# Patient Record
Sex: Female | Born: 1995 | Hispanic: Yes | Marital: Married | Smoking: Never smoker
Health system: Southern US, Community
[De-identification: ages and names within clinical notes are randomized; demographics above are authoritative.]

## PROBLEM LIST (undated history)

## (undated) DIAGNOSIS — K219 Gastro-esophageal reflux disease without esophagitis: Secondary | ICD-10-CM

## (undated) DIAGNOSIS — D649 Anemia, unspecified: Secondary | ICD-10-CM

## (undated) DIAGNOSIS — R110 Nausea: Secondary | ICD-10-CM

## (undated) HISTORY — PX: OTHER SURGICAL HISTORY: SHX169

## (undated) HISTORY — DX: Anemia, unspecified: D64.9

## (undated) HISTORY — DX: Nausea: R11.0

## (undated) HISTORY — DX: Gastro-esophageal reflux disease without esophagitis: K21.9

---

## 2014-02-12 NOTE — L&D Delivery Note (Signed)
Delivery Note At 2:21 PM a viable female infant was delivered via Vaginal, Spontaneous Delivery (Presentation: LOA ;  ).  APGAR: 8, 9; weight 7 lb 14.3 oz (3580 g).   Placenta status: Intact, Spontaneous.  Cord: 3 vessels with the following complications: .   Anesthesia: Epidural  Episiotomy:   Lacerations: 2nd degree;Perineal Suture Repair: 2.0 chromic Est. Blood Loss (mL):  200cc  Mom to postpartum.  Baby to Couplet care / Skin to Skin.  Leonette MostJohanna K Halfon 01/01/2015, 2:40 PM

## 2014-05-16 ENCOUNTER — Emergency Department
Admit: 2014-05-16 | Disposition: A | Discharge status: Home / Self Care | Payer: Self-pay | Admitting: Emergency Medicine

## 2014-05-16 LAB — CBC
HCT: 39.3 % (ref 35.0–47.0)
HGB: 12.8 g/dL (ref 12.0–16.0)
MCH: 30.6 pg (ref 26.0–34.0)
MCHC: 32.6 g/dL (ref 32.0–36.0)
MCV: 94 fL (ref 80–100)
PLATELETS: 320 10*3/uL (ref 150–440)
RBC: 4.18 10*6/uL (ref 3.80–5.20)
RDW: 13.3 % (ref 11.5–14.5)
WBC: 13.8 10*3/uL — ABNORMAL HIGH (ref 3.6–11.0)

## 2014-05-16 LAB — URINALYSIS, COMPLETE
Bilirubin,UR: NEGATIVE
Blood: NEGATIVE
Glucose,UR: NEGATIVE mg/dL (ref 0–75)
Ketone: NEGATIVE
Leukocyte Esterase: NEGATIVE
Nitrite: NEGATIVE
PH: 9 (ref 4.5–8.0)
RBC,UR: 1 /HPF (ref 0–5)
Specific Gravity: 1.02 (ref 1.003–1.030)
Squamous Epithelial: 1
WBC UR: 4 /HPF (ref 0–5)

## 2014-05-16 LAB — HCG, QUANTITATIVE, PREGNANCY: Beta Hcg, Quant.: 66131 m[IU]/mL — ABNORMAL HIGH

## 2014-05-17 LAB — WET PREP, GENITAL

## 2014-05-17 LAB — GC/CHLAMYDIA PROBE AMP

## 2014-05-17 IMAGING — US US OB < 14 WEEKS - US OB TV
1 series · 14 of 28 positions shown · non-contrast
Comparison: None.

CLINICAL DATA: Vaginal bleeding for 1 day. Right pelvic pain for 1
week. Estimated gestational age by LMP is 7 weeks 1 day.
Quantitative beta HCG is [DATE].

EXAM:
OBSTETRIC <14 WK US AND TRANSVAGINAL OB US
TECHNIQUE: Both transabdominal and transvaginal ultrasound examinations were
performed for complete evaluation of the gestation as well as the
maternal uterus, adnexal regions, and pelvic cul-de-sac.
Transvaginal technique was performed to assess early pregnancy.

[Series 1: us ob < 14 weeks - us ob tv · 0.21mm/px · 14 of 96 slices shown]
[im 4/96]
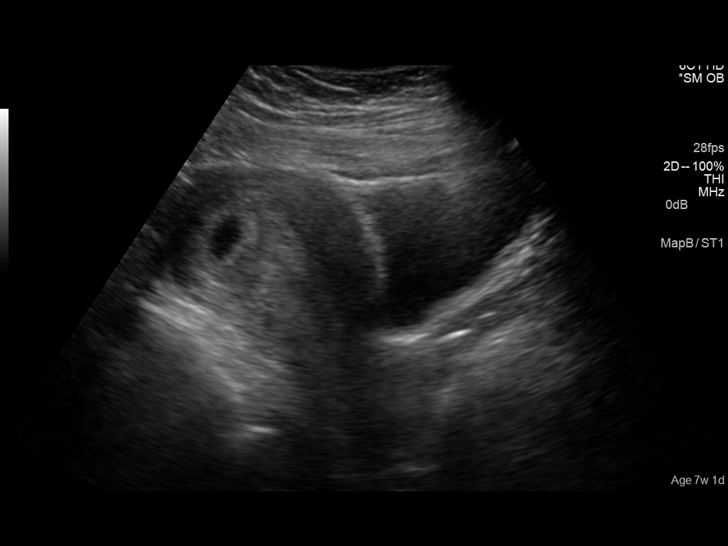
[im 11/96]
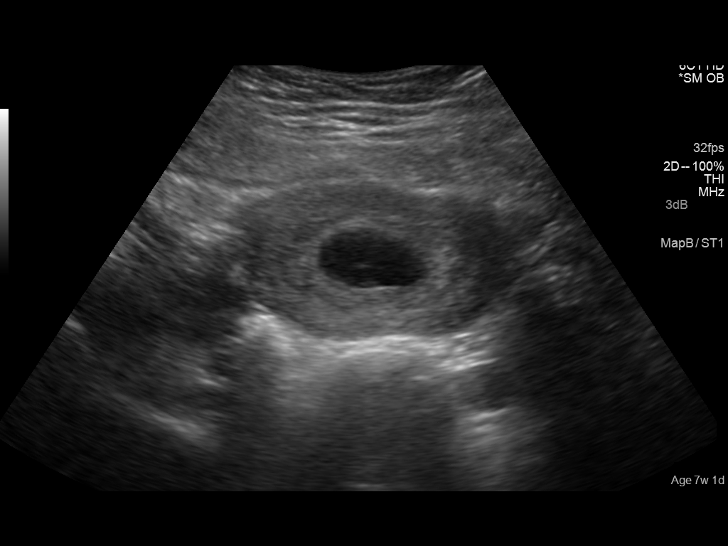
[im 18/96]
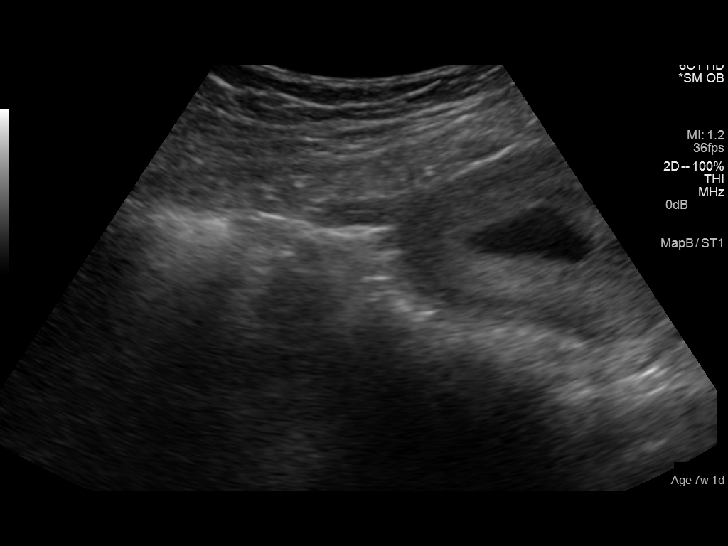
[im 25/96]
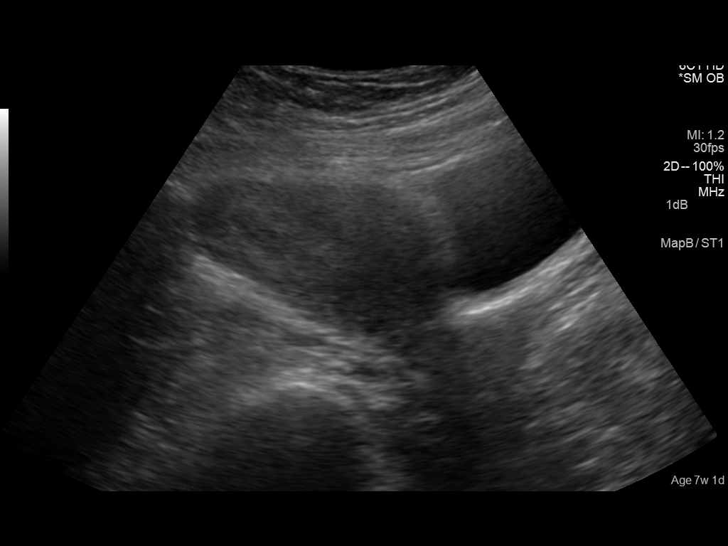
[im 32/96]
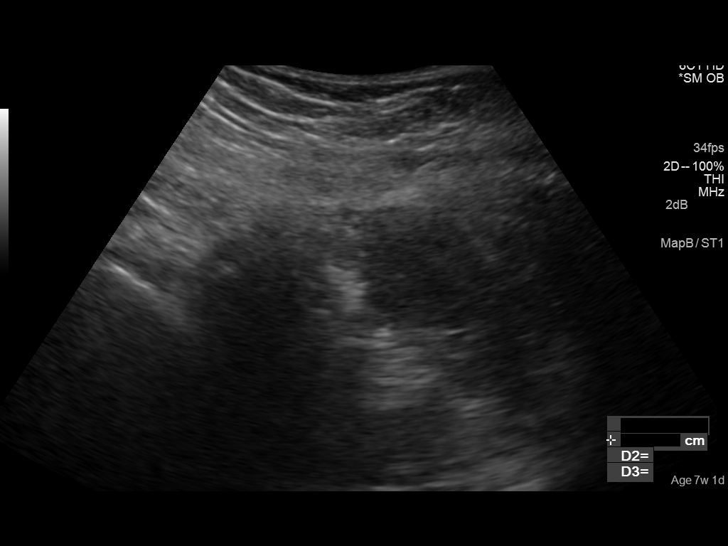
[im 39/96]
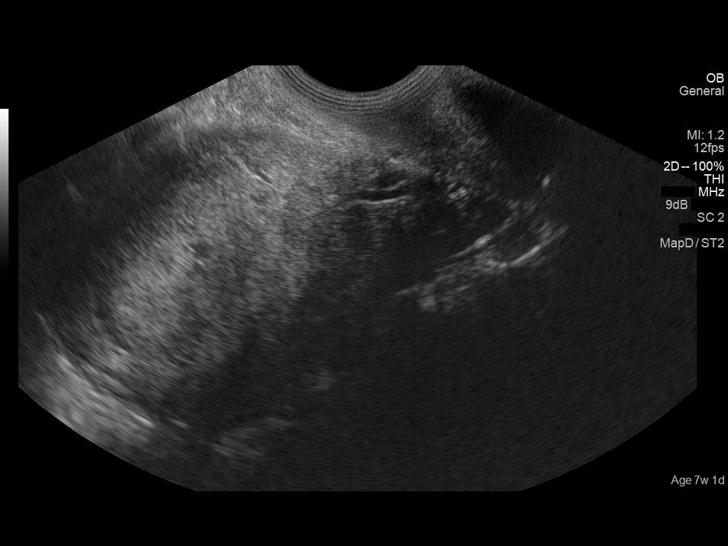
[im 46/96]
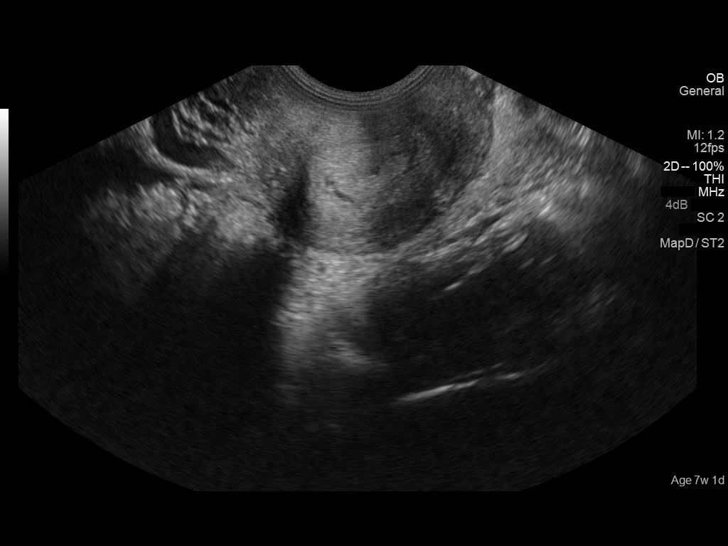
[im 53/96]
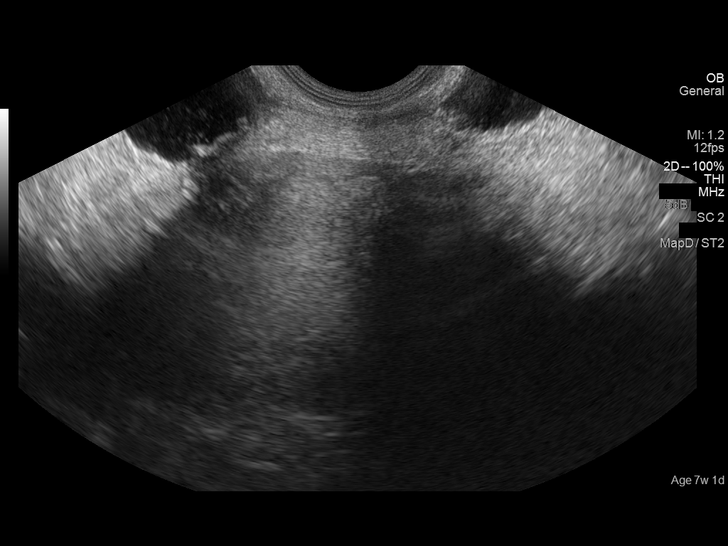
[im 60/96]
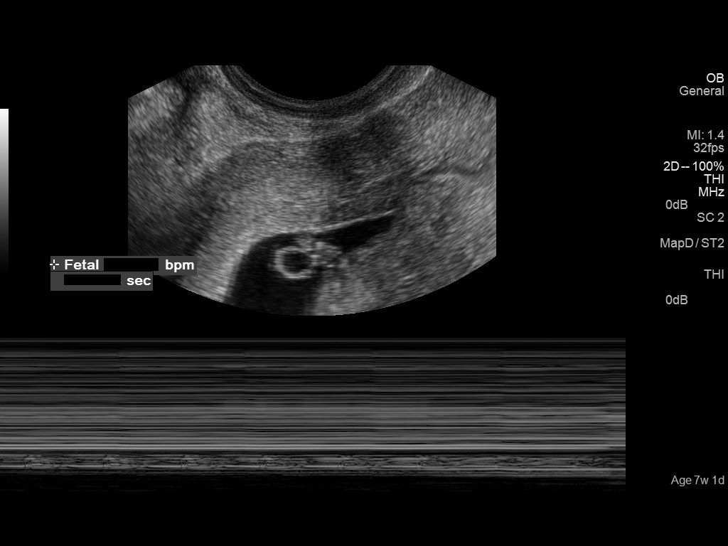
[im 67/96]
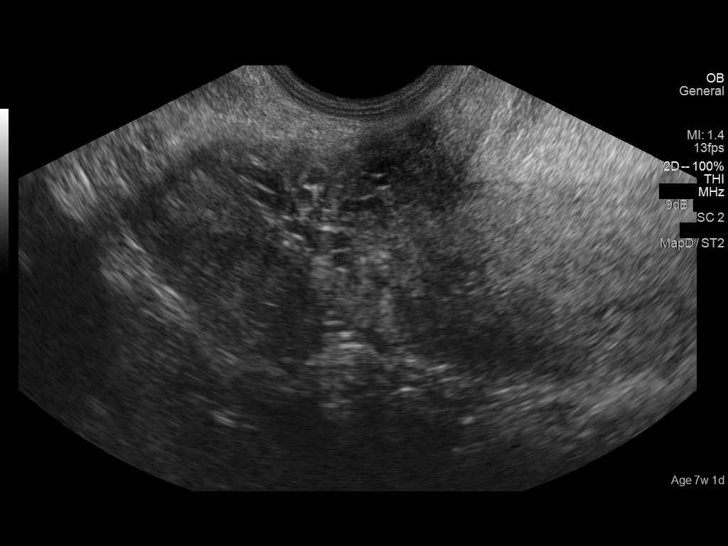
[im 74/96]
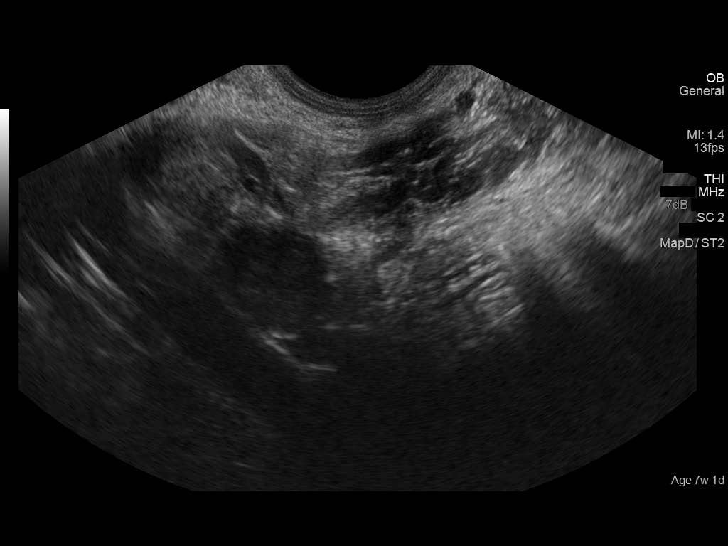
[im 81/96]
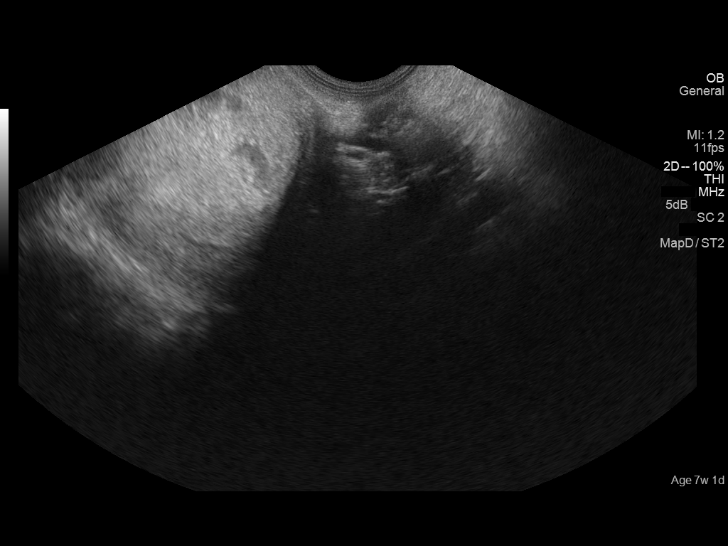
[im 88/96]
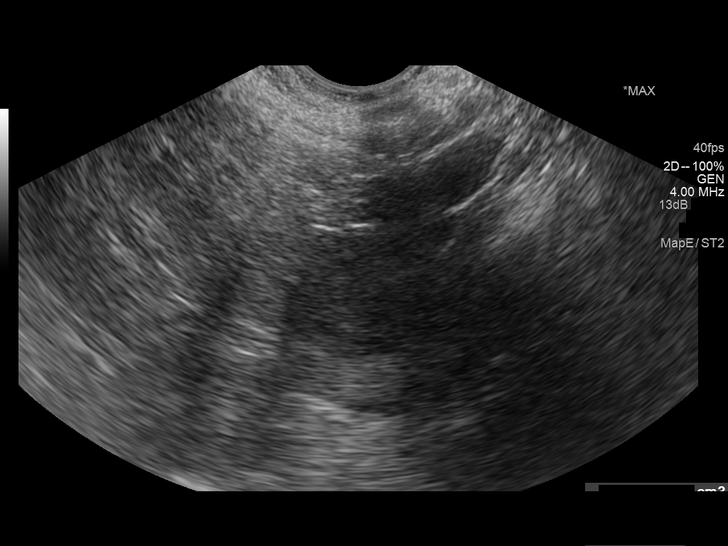
[im 96/96]
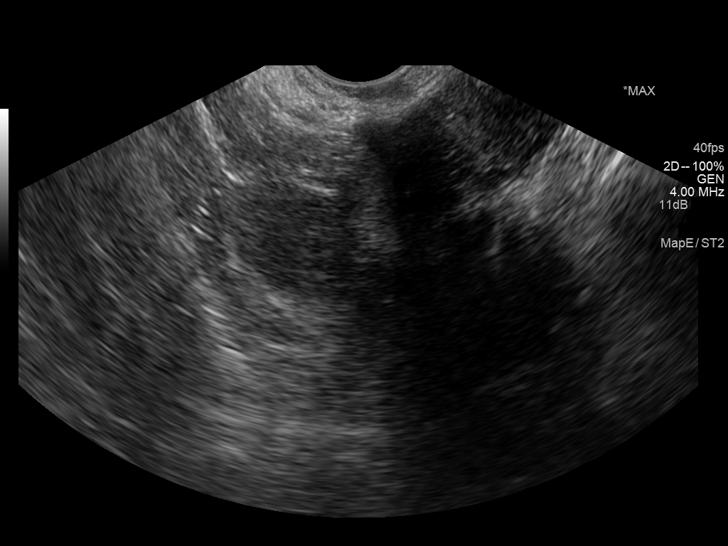

[14 of 28 positions shown; findings below may reference images not displayed]

FINDINGS: Intrauterine gestational sac: Single intrauterine gestational sac is
present.

Yolk sac:  Yolk sac is visualized.

Embryo:  Fetal pole is present.

Cardiac Activity: Fetal cardiac activity is visualized.

Heart Rate: 131  bpm

CRL:  7  mm   6 w   4 d                  US EDC: [DATE]

Maternal uterus/adnexae: Uterus is anteverted. No myometrial mass
lesions. No subchorionic hemorrhage. Both ovaries are visualized.
Bilateral corpus luteum cysts are suggested. No free pelvic fluid
collections.
IMPRESSION: Single intrauterine pregnancy. Estimated gestational age by
crown-rump length is 6 weeks 4 days. No acute complications
suggested.

## 2014-05-26 ENCOUNTER — Emergency Department
Admit: 2014-05-26 | Disposition: A | Discharge status: Home / Self Care | Payer: Self-pay | Admitting: Emergency Medicine

## 2014-05-26 LAB — CBC WITH DIFFERENTIAL/PLATELET
BASOS PCT: 0.4 %
Basophil #: 0.1 10*3/uL (ref 0.0–0.1)
EOS PCT: 1 %
Eosinophil #: 0.2 10*3/uL (ref 0.0–0.7)
HCT: 38.1 % (ref 35.0–47.0)
HGB: 12.8 g/dL (ref 12.0–16.0)
LYMPHS ABS: 3.7 10*3/uL — AB (ref 1.0–3.6)
Lymphocyte %: 23.7 %
MCH: 31.4 pg (ref 26.0–34.0)
MCHC: 33.5 g/dL (ref 32.0–36.0)
MCV: 94 fL (ref 80–100)
MONO ABS: 1 x10 3/mm — AB (ref 0.2–0.9)
MONOS PCT: 6.5 %
Neutrophil #: 10.8 10*3/uL — ABNORMAL HIGH (ref 1.4–6.5)
Neutrophil %: 68.4 %
Platelet: 341 10*3/uL (ref 150–440)
RBC: 4.07 10*6/uL (ref 3.80–5.20)
RDW: 12.9 % (ref 11.5–14.5)
WBC: 15.7 10*3/uL — AB (ref 3.6–11.0)

## 2014-05-26 LAB — WET PREP, GENITAL

## 2014-05-26 LAB — URINALYSIS, COMPLETE
BLOOD: NEGATIVE
Bilirubin,UR: NEGATIVE
Glucose,UR: NEGATIVE mg/dL (ref 0–75)
Ketone: NEGATIVE
Leukocyte Esterase: NEGATIVE
Nitrite: NEGATIVE
PH: 7 (ref 4.5–8.0)
PROTEIN: NEGATIVE
Specific Gravity: 1.008 (ref 1.003–1.030)

## 2014-05-26 LAB — COMPREHENSIVE METABOLIC PANEL
ALBUMIN: 4.2 g/dL
ALK PHOS: 49 U/L
ANION GAP: 7 (ref 7–16)
BUN: 7 mg/dL
Bilirubin,Total: 0.2 mg/dL — ABNORMAL LOW
CHLORIDE: 107 mmol/L
CREATININE: 0.36 mg/dL — AB
Calcium, Total: 9.1 mg/dL
Co2: 21 mmol/L — ABNORMAL LOW
EGFR (Non-African Amer.): >60
Glucose: 103 mg/dL — ABNORMAL HIGH
POTASSIUM: 3.7 mmol/L
SGOT(AST): 19 U/L
SGPT (ALT): 20 U/L
SODIUM: 135 mmol/L
Total Protein: 7.2 g/dL

## 2014-05-26 LAB — HCG, QUANTITATIVE, PREGNANCY: Beta Hcg, Quant.: 158353 m[IU]/mL — ABNORMAL HIGH

## 2014-05-27 LAB — OB RESULTS CONSOLE VARICELLA ZOSTER ANTIBODY, IGG: Varicella: IMMUNE

## 2014-05-27 LAB — GC/CHLAMYDIA PROBE AMP

## 2014-05-27 LAB — SICKLE CELL SCREEN: Sickle Cell Screen: NEGATIVE

## 2014-05-27 LAB — OB RESULTS CONSOLE RUBELLA ANTIBODY, IGM: Rubella: IMMUNE

## 2014-06-28 ENCOUNTER — Ambulatory Visit
Admission: RE | Admit: 2014-06-28 | Discharge: 2014-06-28 | Disposition: A | Discharge status: Home / Self Care | Payer: Self-pay | Source: Ambulatory Visit | Attending: Maternal & Fetal Medicine | Admitting: Maternal & Fetal Medicine

## 2014-06-28 VITALS — BP 123/71 | HR 97 | Temp 99.1°F | Ht 61.0 in | Wt 156.0 lb

## 2014-06-28 DIAGNOSIS — Z369 Encounter for antenatal screening, unspecified: Secondary | ICD-10-CM

## 2014-06-28 NOTE — Progress Notes (Signed)
History reviewed with CGC, Wells.  Agree with above.

## 2014-06-28 NOTE — Progress Notes (Signed)
Jane Brewer, Jane Brewer* Length of Consultation: 45 minutes   [name]  [facility] to review prenatal screening and testing options.  This note summarizes the information we discussed with the aid of a Spanish interpreter.    First trimester screening includes nuchal translucency ultrasound and first trimester maternal serum marker screening.  The nuchal translucency has approximately an 80% detection rate for Down syndrome and can be positive for other chromosome abnormalities as well as congenital heart defects.  When combined with a maternal serum marker screening, the detection rate is up to 90% for Down syndrome and up to 97% for trisomy 18.     Maternal serum marker screening, a blood test that measures pregnancy proteins, can provide risk assessments for Down syndrome, trisomy 18, and open neural tube defects (spina bifida, anencephaly). Because it does not directly examine the fetus, it cannot positively diagnose or rule out these problems.  Targeted ultrasound uses high frequency sound waves to create an image of the developing fetus.  An ultrasound is often recommended as a routine means of evaluating the pregnancy.  It is also used to screen for fetal anatomy problems (for example, a heart defect) that might be suggestive of a chromosomal or other abnormality.   Should these screening tests indicate an increased concern, then the following diagnostic options would be offered:  The chorionic villus sampling procedure is available for first trimester chromosome analysis.  This involves the withdrawal of a small amount of chorionic villi (tissue from the developing placenta).  Risk of pregnancy loss is estimated to be approximately 1 in 200 to 1 in 100 (0.5 to 1%).  There is approximately a 1% (1 in 100) chance that the CVS chromosome results will be unclear.  Chorionic villi cannot be tested for neural tube defects.      Amniocentesis involves the removal of a small amount of amniotic fluid from the sac surrounding the fetus with the use of a thin needle inserted through the maternal abdomen and uterus.  Ultrasound guidance is used throughout the procedure.  Fetal cells from amniotic fluid are directly evaluated and > 99.5% of chromosome problems and > 98% of open neural tube defects can be detected. This procedure is generally performed after the 15th week of pregnancy.  The main risks to this procedure include complications leading to miscarriage in less than 1 in 200 cases (0.5%).  As another option for information if the pregnancy is suspected to be an an increased chance for certain chromosome conditions, we also reviewed the availability of cell free fetal DNA testing from maternal blood to determine whether or not the baby may have either Down syndrome, trisomy 3313, or trisomy 4318.  This test utilizes a maternal blood sample and DNA sequencing technology to isolate circulating cell free fetal DNA from maternal plasma.  The fetal DNA can then be analyzed for DNA sequences that are derived from the three most common chromosomes involved in aneuploidy, chromosomes 13, 18, and 21.  If the overall amount of DNA is greater than the expected level for any of these chromosomes, aneuploidy is suspected.  While we do not consider it a replacement for invasive testing and karyotype analysis, a negative result from this testing would be reassuring, though not a guarantee of a normal chromosome complement for the baby.  An abnormal result is certainly suggestive of an abnormal chromosome complement, though we would still recommend CVS or amniocentesis to confirm any findings  from this testing.  We obtained a detailed family history and pregnancy history.  The patient reported one paternal first cousin with speech problems and incontinence at age 767.  He has no known birth differences and no specific diagnosis is known for his  developmental differences.  We reviewed that there may be many reasons for the features described, but that without additional medical information, a risk assessment for this pregnancy is difficult.  If more is learned, we are happy to review this information further.  The remainder of the family history was reported to be unremarkable for birth defects, mental retardation, recurrent pregnancy loss or known chromosome abnormalities.  [name] reported no complications or exposures in the pregnancy that would be expected to increase the risk for birth defects.  After consideration of the options, [name] elected to proceed with first trimester screening.  Blood was drawn today for the biochemical portion.  However, due to scheduling issues she was scheduled to return to clinic on Thursday, Jul 01, 2014 at 8:00 am for the nuchal translucency measurement and risk assessment.  [name] was encouraged to call with questions or concerns.  We can be contacted at [phone].  Cherly Andersoneborah F. Davanta Meuser, MS, CGC

## 2014-07-01 ENCOUNTER — Ambulatory Visit
Admission: RE | Admit: 2014-07-01 | Discharge: 2014-07-01 | Disposition: A | Discharge status: Home / Self Care | Payer: Self-pay | Source: Ambulatory Visit | Attending: Obstetrics and Gynecology | Admitting: Obstetrics and Gynecology

## 2014-07-01 VITALS — BP 118/83 | HR 80 | Ht 61.0 in | Wt 158.0 lb

## 2014-07-01 DIAGNOSIS — Z1389 Encounter for screening for other disorder: Secondary | ICD-10-CM

## 2014-07-01 DIAGNOSIS — Z369 Encounter for antenatal screening, unspecified: Secondary | ICD-10-CM

## 2014-07-01 DIAGNOSIS — Z3A13 13 weeks gestation of pregnancy: Secondary | ICD-10-CM | POA: Insufficient documentation

## 2014-07-01 DIAGNOSIS — Z36 Encounter for antenatal screening of mother: Secondary | ICD-10-CM | POA: Insufficient documentation

## 2014-07-01 IMAGING — US US OB COMP LESS 14 WK
1 series · 14 of 28 positions shown · non-contrast
Comparison: none

[Series 1: us ob comp less 14 wk · 0.16mm/px · 14 of 101 slices shown]
[im 4/101]
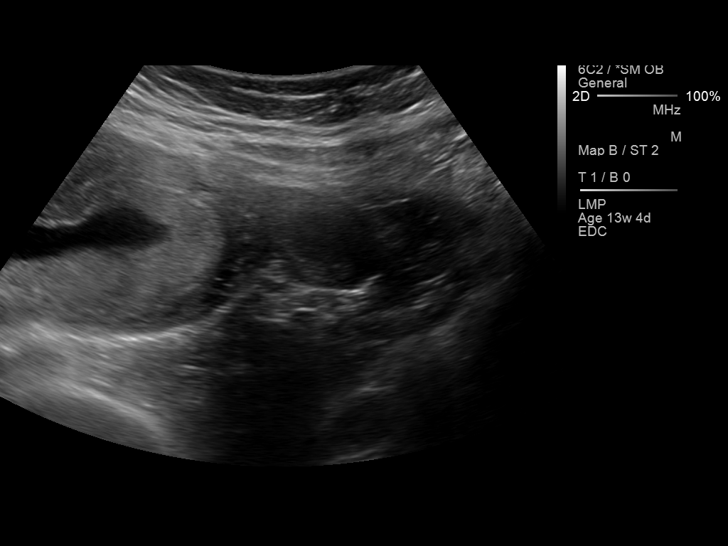
[im 12/101]
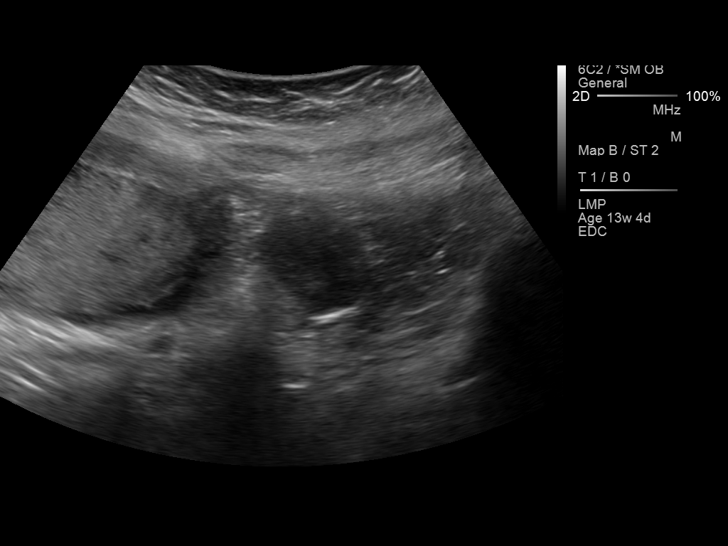
[im 19/101]
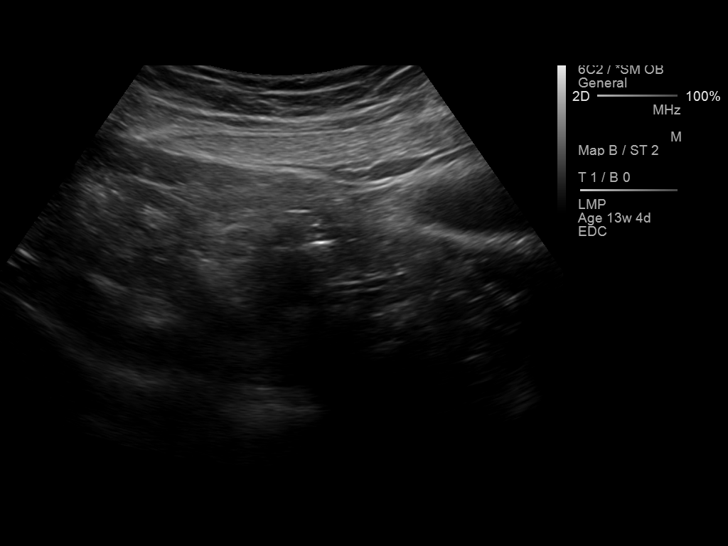
[im 26/101]
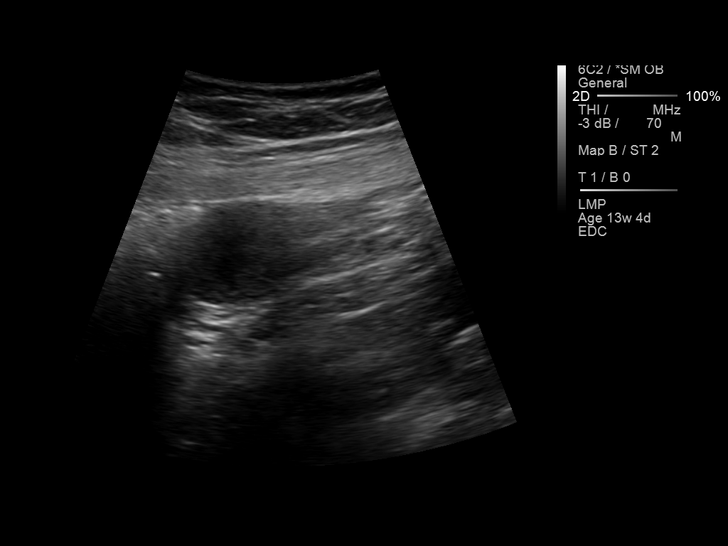
[im 34/101]
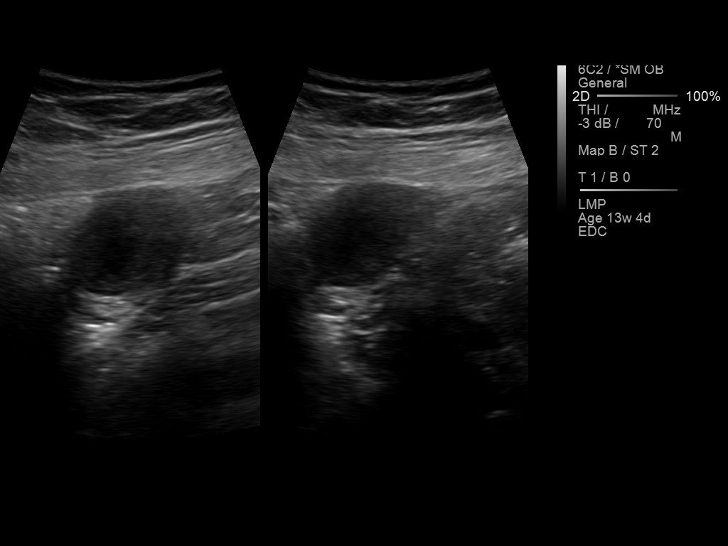
[im 41/101]
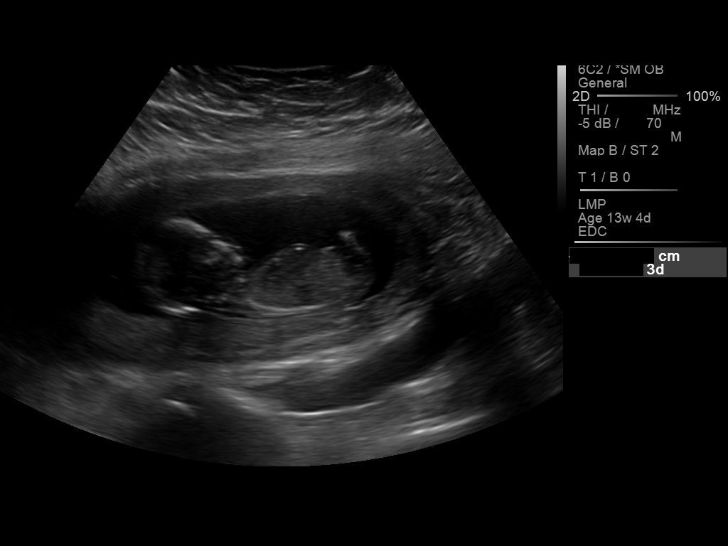
[im 49/101]
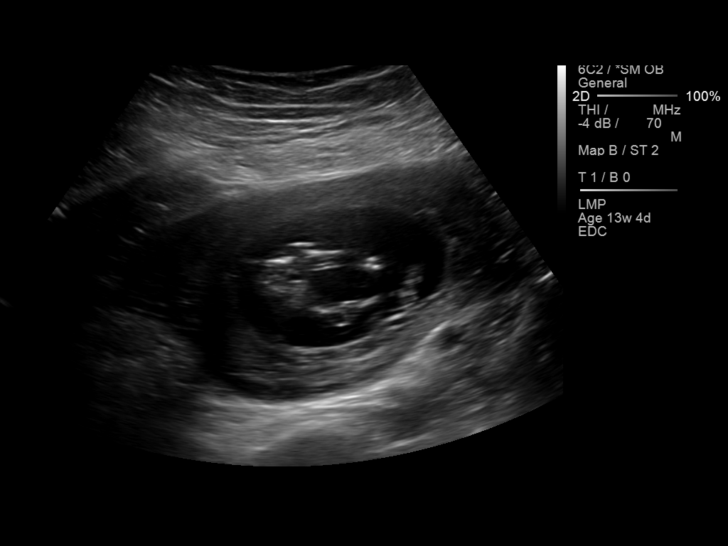
[im 56/101]
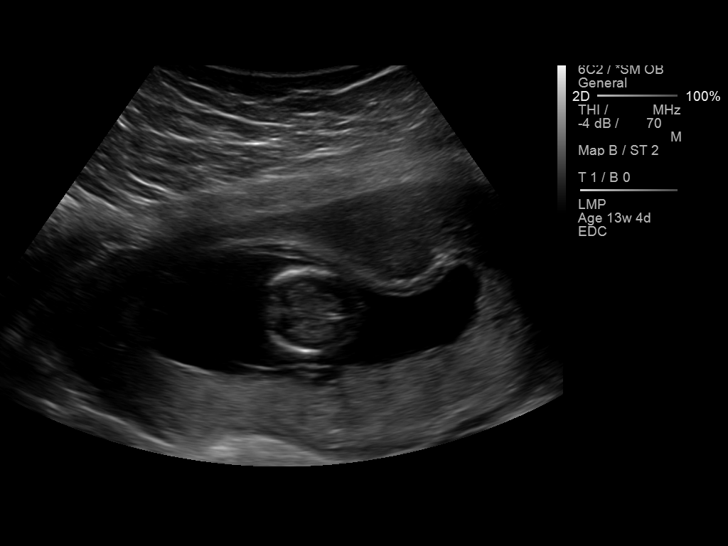
[im 63/101]
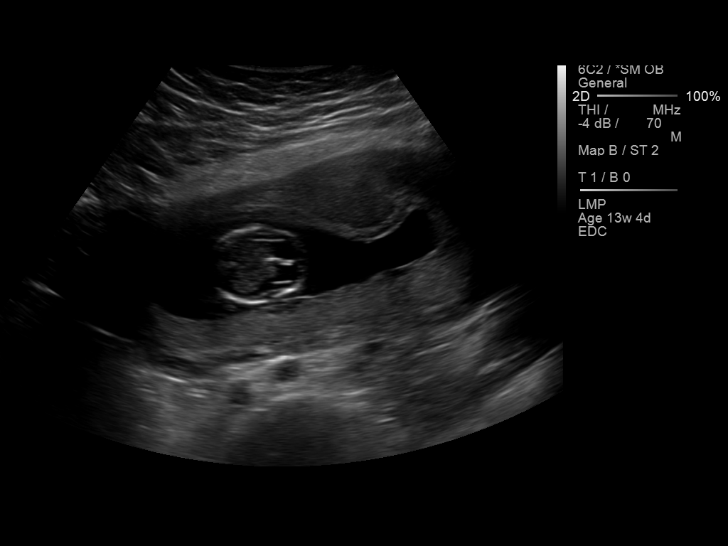
[im 71/101]
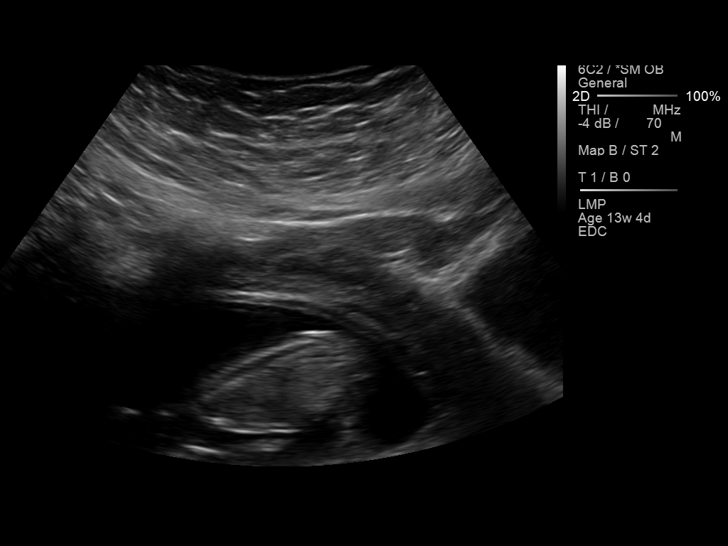
[im 78/101]
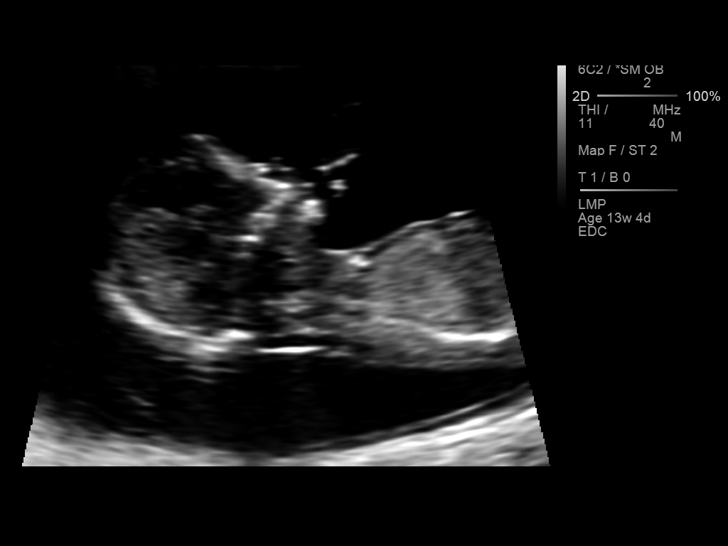
[im 86/101]
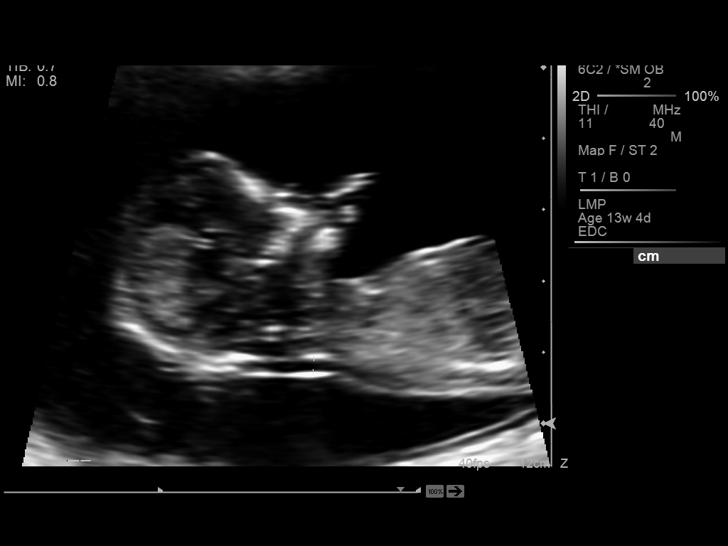
[im 93/101]
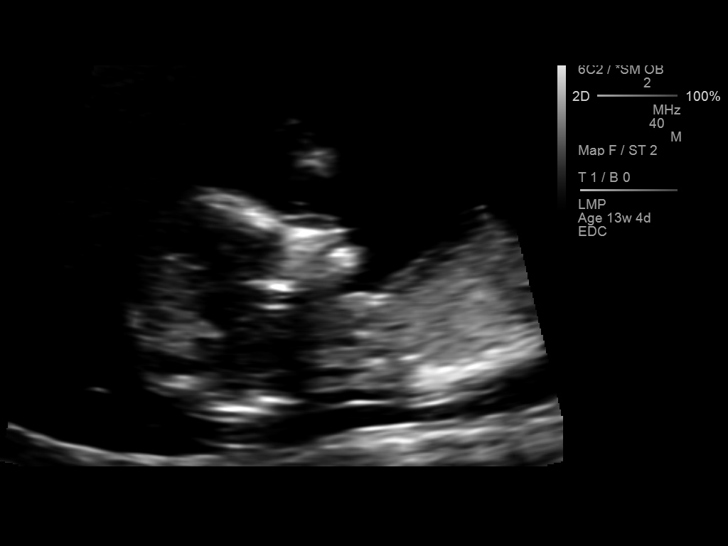
[im 101/101]
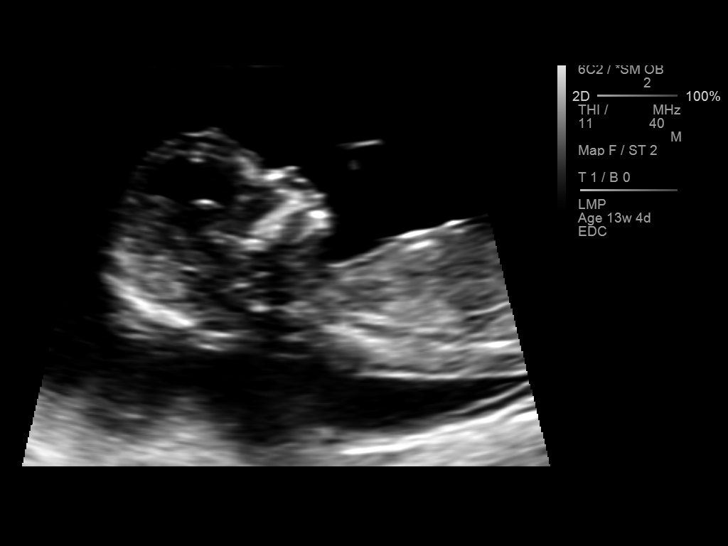

[14 of 28 positions shown; findings below may reference images not displayed]

Canned report from images found in remote index.

Refer to host system for actual result text.

## 2014-07-05 NOTE — Progress Notes (Signed)
I agree with recommendations as outlined above

## 2014-07-07 IMAGING — US US OB DETAIL+14 WK
1 series · 14 of 28 positions shown · non-contrast
Comparison: none

[Series 1: us ob detail+14 wk · 0.26mm/px · 14 of 150 slices shown]
[im 6/150]
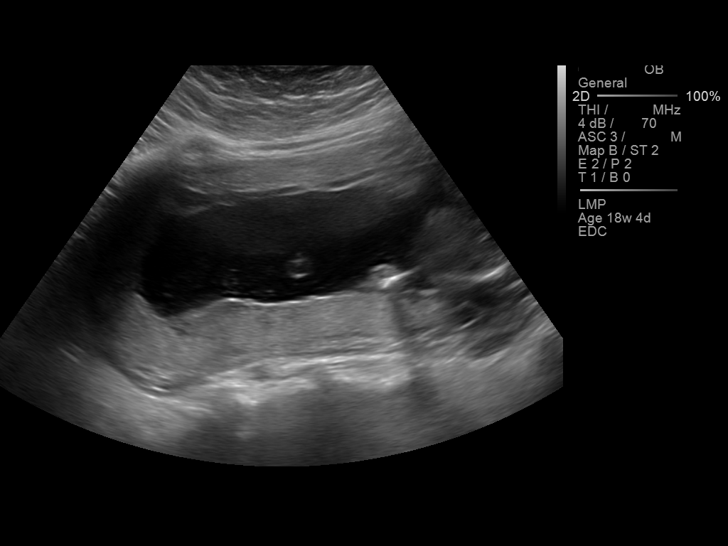
[im 17/150]
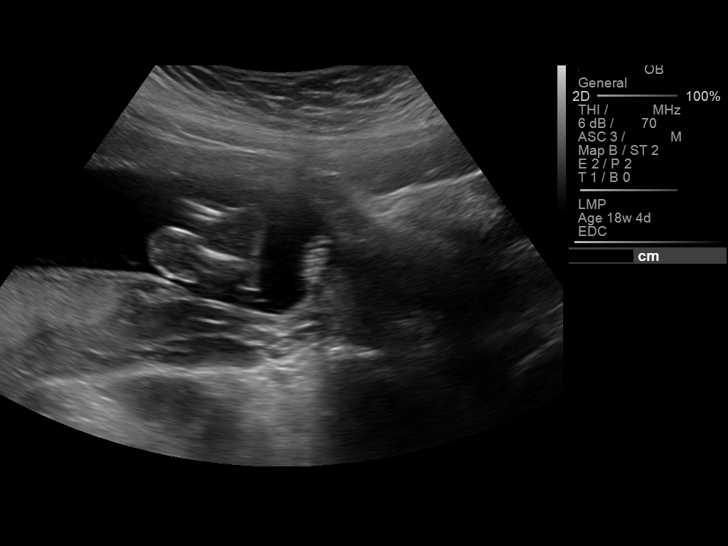
[im 28/150]
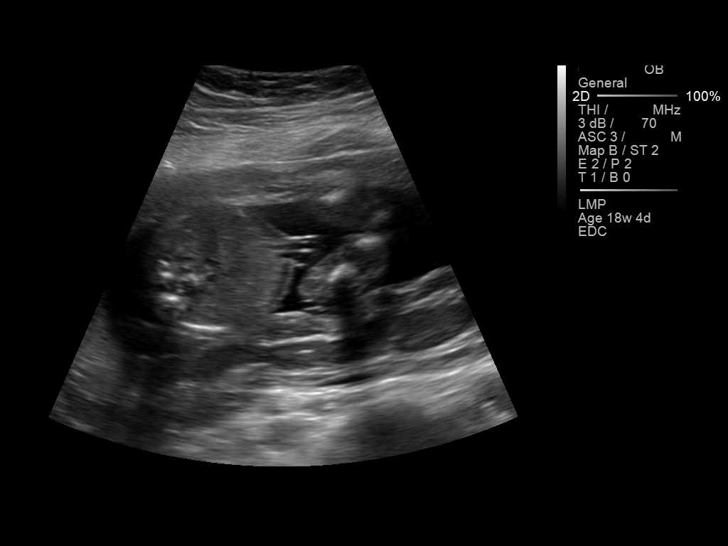
[im 39/150]
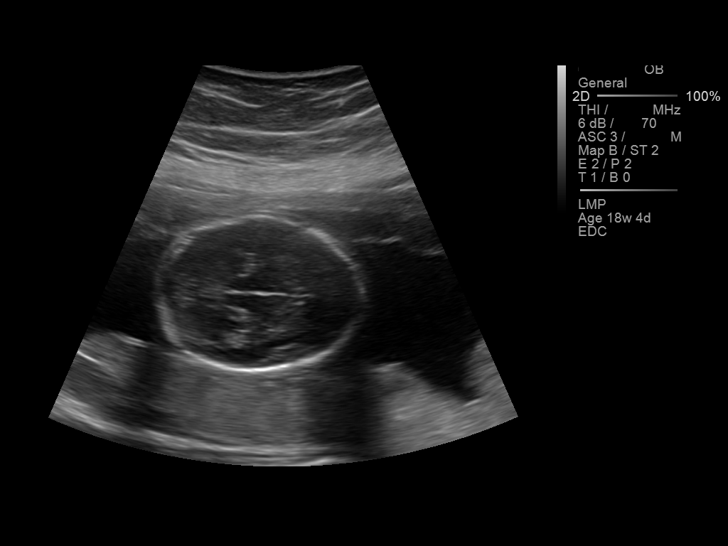
[im 50/150]
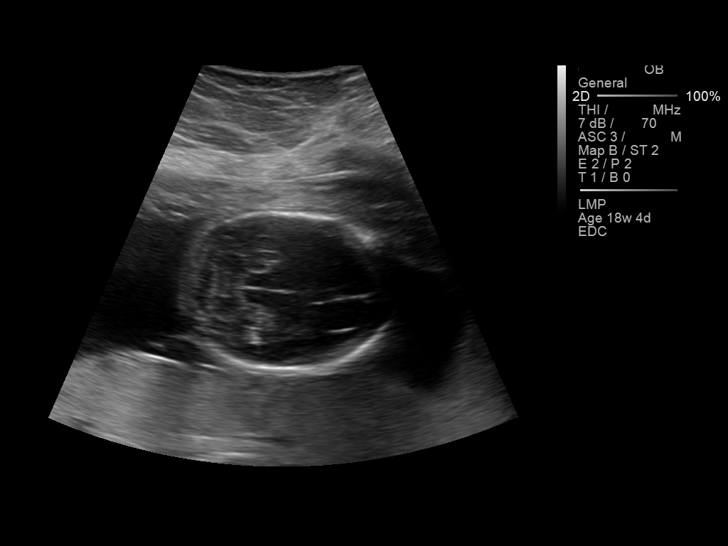
[im 61/150]
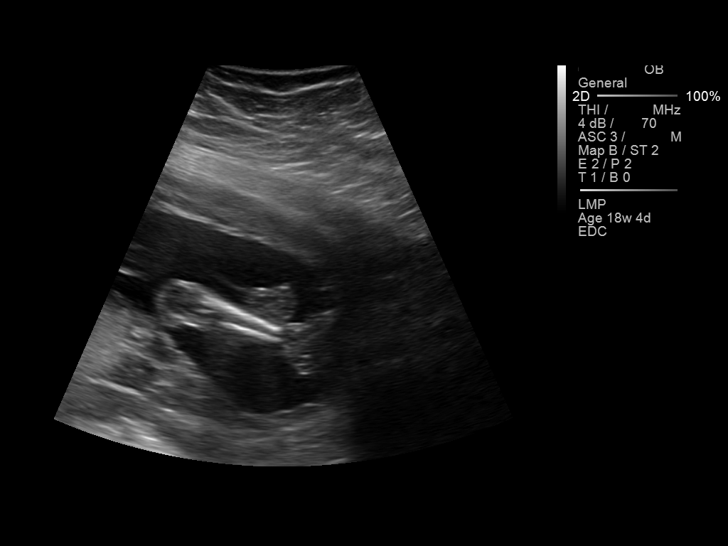
[im 72/150]
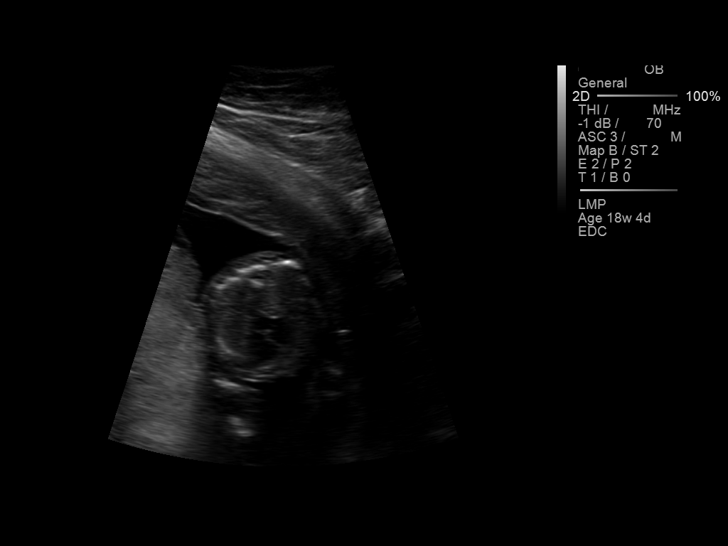
[im 83/150]
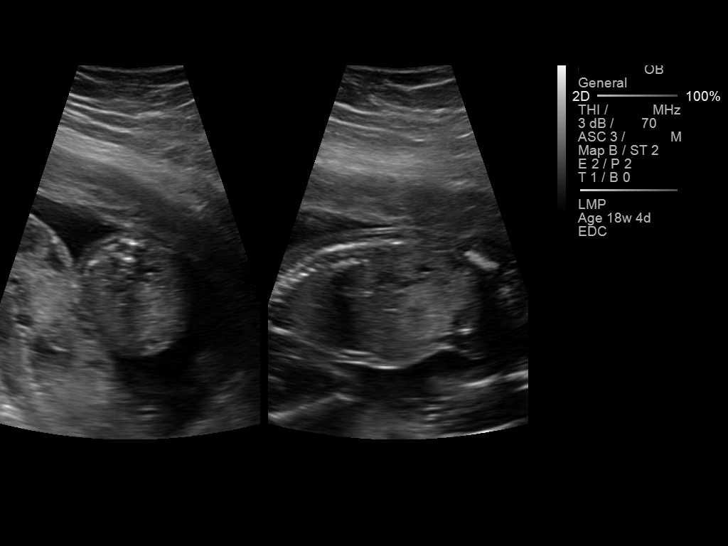
[im 94/150]
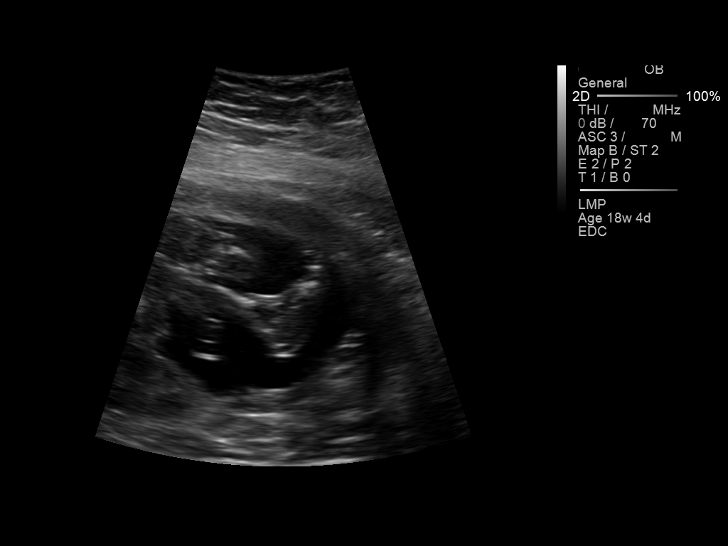
[im 105/150]
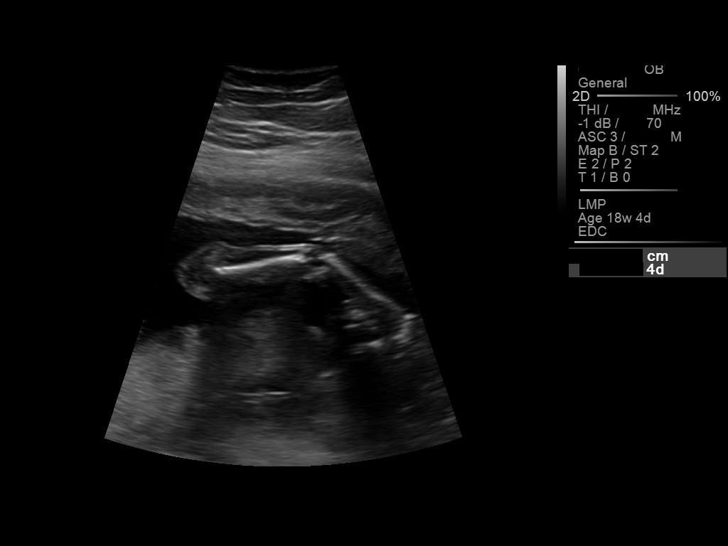
[im 116/150]
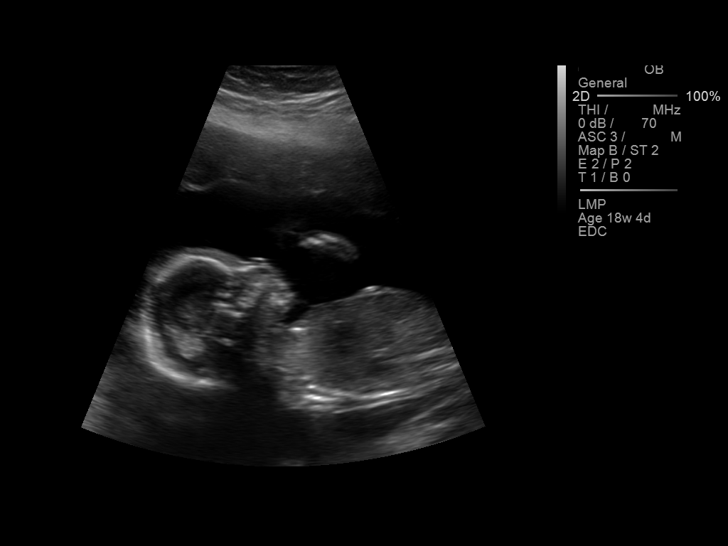
[im 127/150]
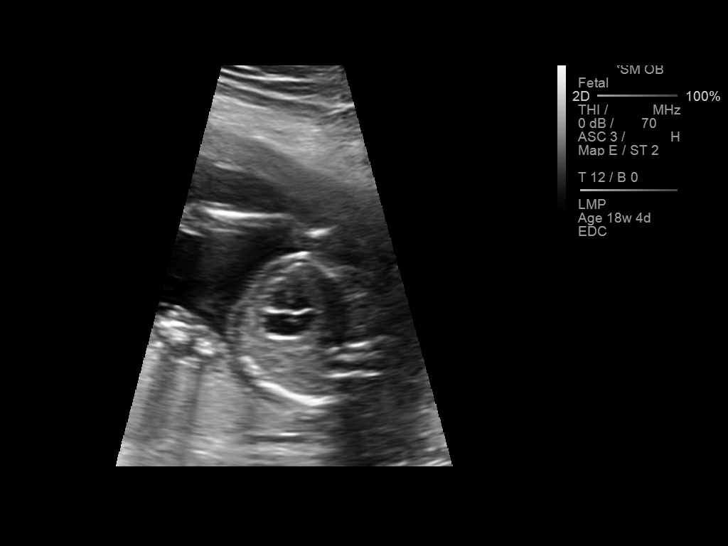
[im 138/150]
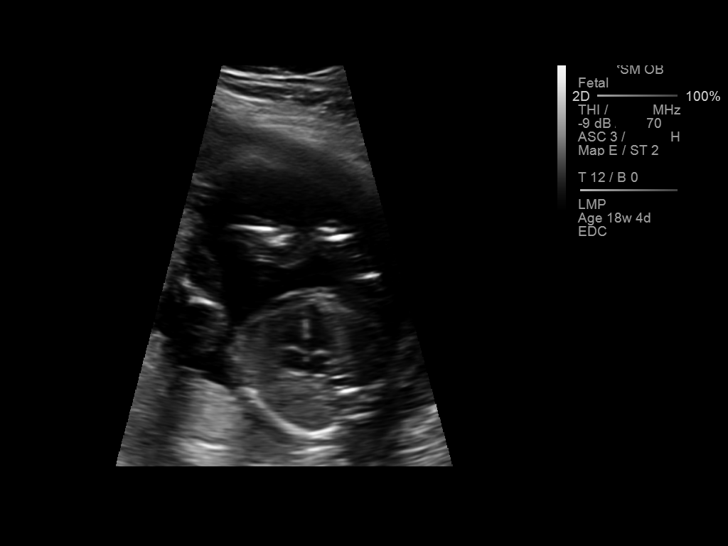
[im 150/150]
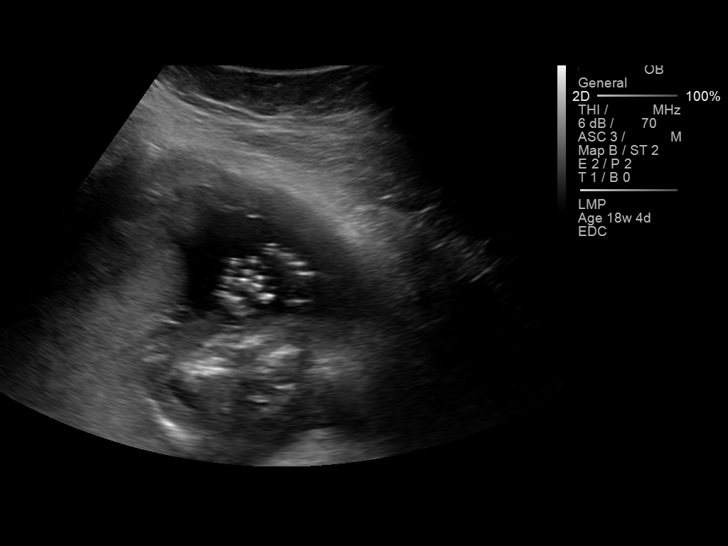

[14 of 28 positions shown; findings below may reference images not displayed]

Canned report from images found in remote index.

Refer to host system for actual result text.

## 2014-08-05 ENCOUNTER — Ambulatory Visit
Admission: RE | Admit: 2014-08-05 | Discharge: 2014-08-05 | Disposition: A | Discharge status: Home / Self Care | Payer: Medicaid Other | Source: Ambulatory Visit | Attending: Maternal & Fetal Medicine | Admitting: Maternal & Fetal Medicine

## 2014-08-05 ENCOUNTER — Other Ambulatory Visit: Payer: Self-pay | Admitting: Obstetrics and Gynecology

## 2014-08-05 DIAGNOSIS — Z3A18 18 weeks gestation of pregnancy: Secondary | ICD-10-CM | POA: Insufficient documentation

## 2014-08-05 DIAGNOSIS — Z36 Encounter for antenatal screening of mother: Secondary | ICD-10-CM | POA: Insufficient documentation

## 2014-08-05 DIAGNOSIS — Z1389 Encounter for screening for other disorder: Secondary | ICD-10-CM

## 2014-08-05 LAB — US OB DETAIL + 14 WK

## 2014-08-05 NOTE — Progress Notes (Signed)
Patient seen, agree with plans as outlined in genetic counselor's note.  

## 2014-08-29 IMAGING — US US OB FOLLOW-UP
1 series · 14 of 28 positions shown · non-contrast
Comparison: none

ADDENDUM:
Voice recognition error in the original dictation.

Previa: NO
CLINICAL DATA: Large for dates
EXAM:
OBSTETRIC ULTRASOUND FOLLOW-UP

[Series 1: us ob follow-up · 0.28mm/px · 14 of 37 slices shown]
[im 2/37]
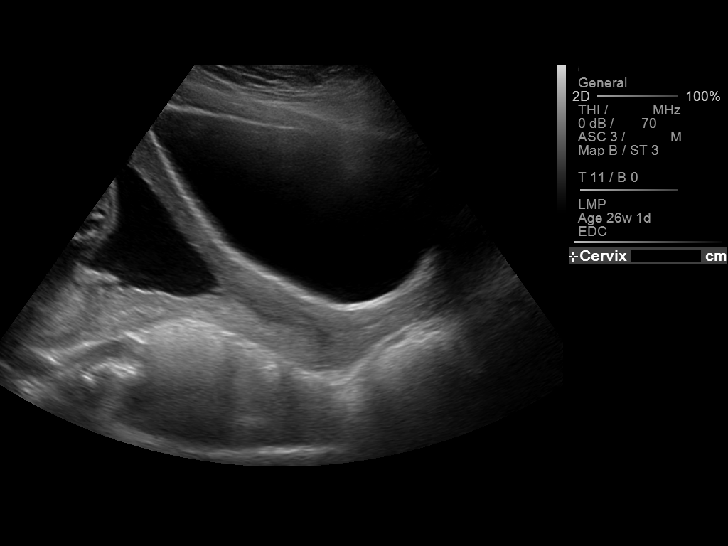
[im 5/37]
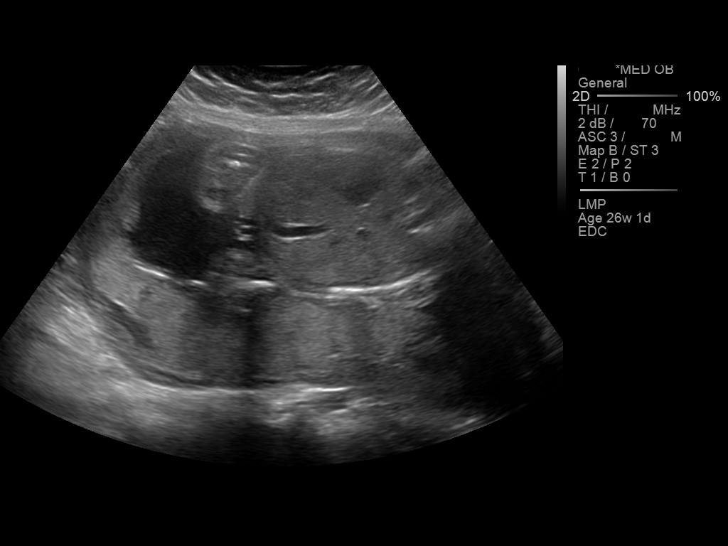
[im 7/37]
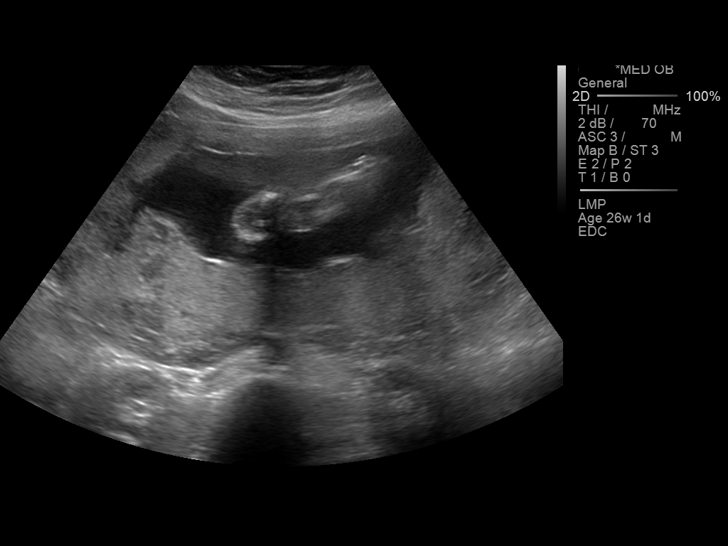
[im 10/37]
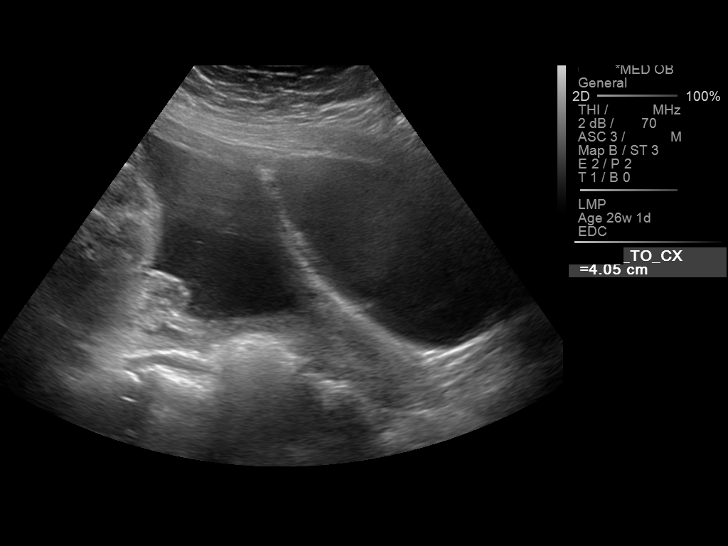
[im 13/37]
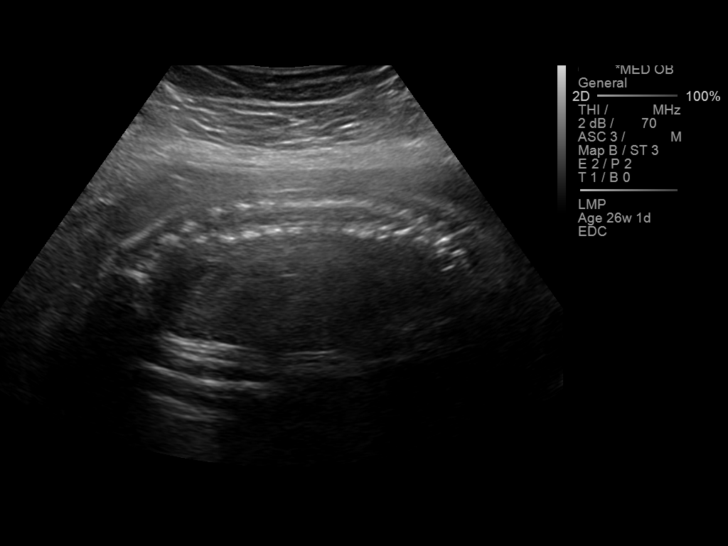
[im 15/37]
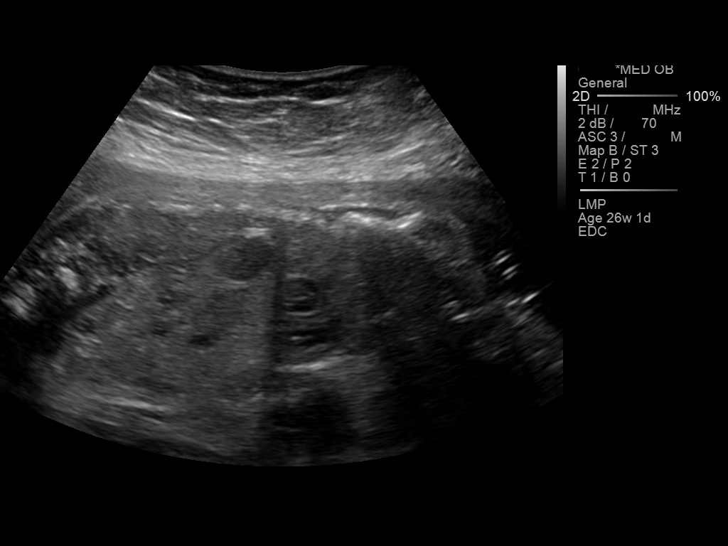
[im 18/37]
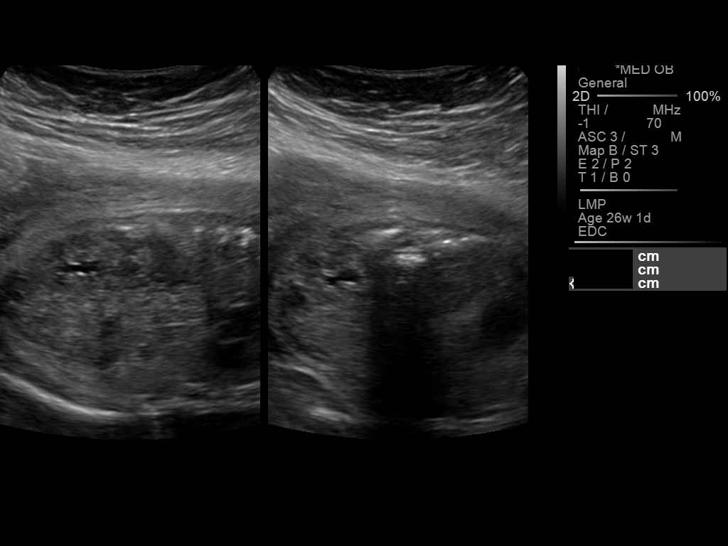
[im 21/37]
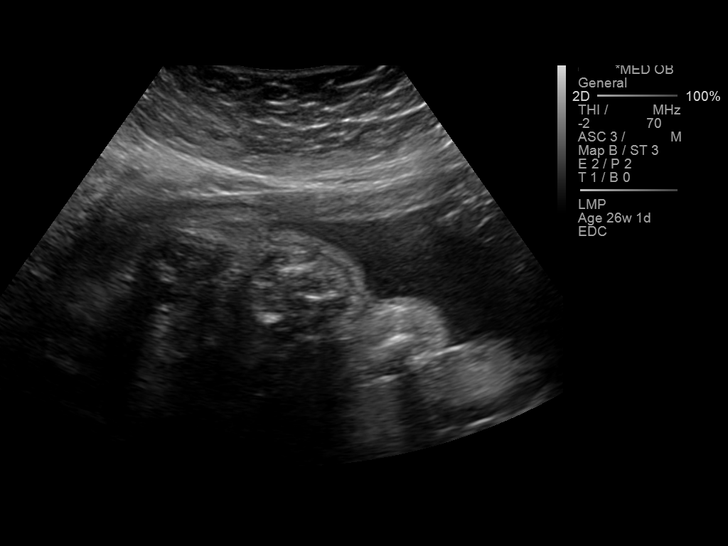
[im 23/37]
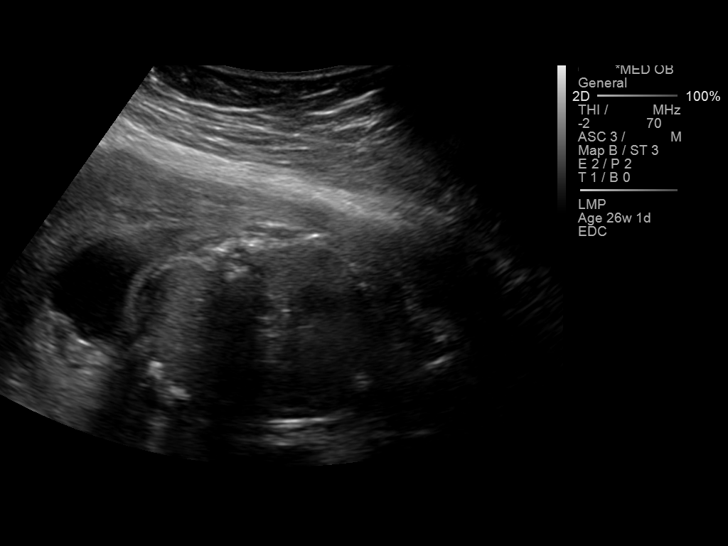
[im 26/37]
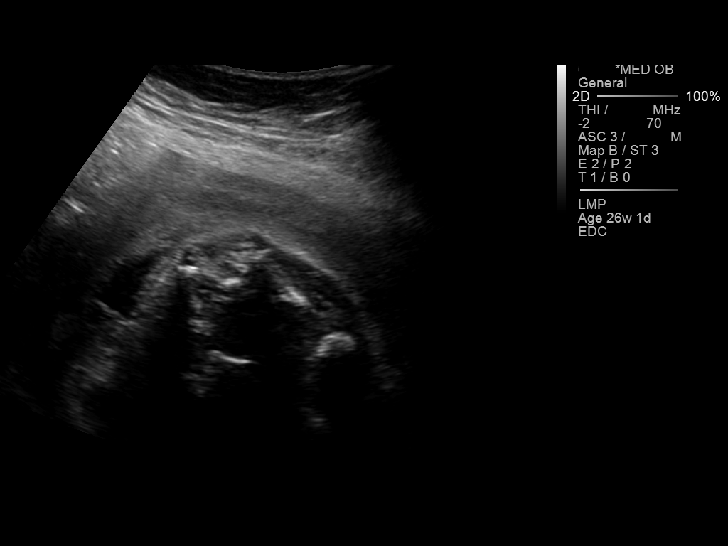
[im 29/37]
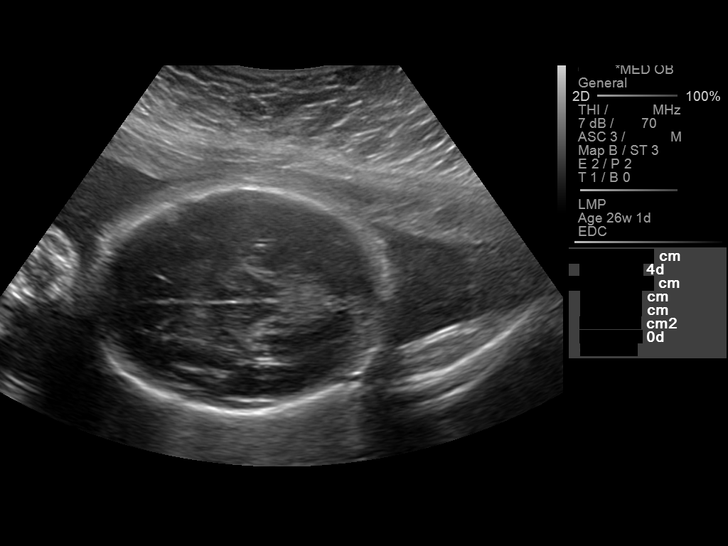
[im 31/37]
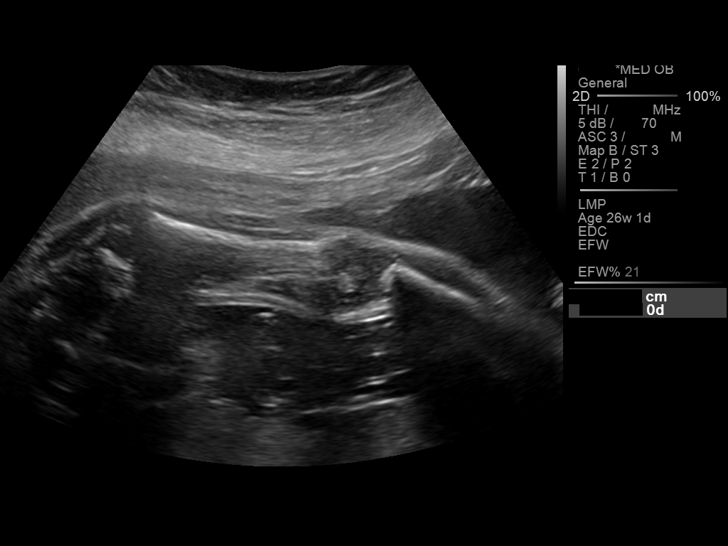
[im 34/37]
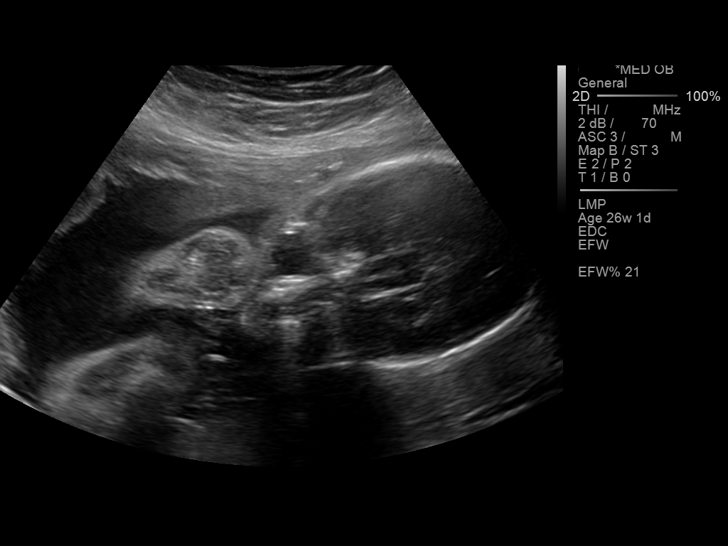
[im 37/37]
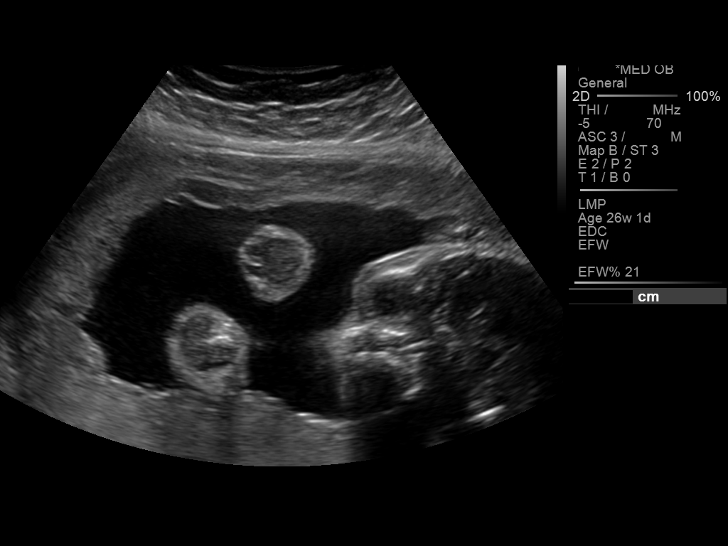

[14 of 28 positions shown; findings below may reference images not displayed]

FINDINGS: Number of Fetuses: 1

Heart Rate:  152 bpm

Movement: Present

Presentation: Transverse

Previa: Known

Placental Location: Posterior

Amniotic Fluid (Subjective): Normal

Vertical pocket 5.5cm

FETAL BIOMETRY

BPD:  6.3cm 25w 4d

HC:    23.9cm  26w   0d

AC:   21.6cm  26w   0d

FL:   4.5cm  25w   0d

Current Mean GA: 25w 5d              US EDC: [DATE]

FETAL ANATOMY

Lateral Ventricles: Previously seen

Thalami/CSP: Previously seen

Posterior Fossa:  Previously seen

Nuchal Region: Previously seen

Upper Lip: Previously seen

Spine: Appears normal

4 Chamber Heart on Left: Previously seen

LVOT: Previously seen

RVOT: Previously seen

Stomach on Left: Appears normal

3 Vessel Cord: Previously seen

Cord Insertion site: Previously seen

Kidneys: Appears normal

Bladder: Appears normal

Extremities: Previously seen

Sex: Male

Technically difficult due to: None

Maternal Findings:

Cervix:  Normal cervical length 4.8 cm.  Closed.
IMPRESSION: Single live intrauterine pregnancy as detailed above.

## 2014-09-17 ENCOUNTER — Other Ambulatory Visit: Payer: Self-pay | Admitting: Advanced Practice Midwife

## 2014-09-24 ENCOUNTER — Ambulatory Visit: Payer: Medicaid Other

## 2014-09-24 ENCOUNTER — Ambulatory Visit: Admission: RE | Admit: 2014-09-24 | Payer: Self-pay | Source: Ambulatory Visit

## 2014-09-27 ENCOUNTER — Ambulatory Visit: Payer: Self-pay

## 2014-09-27 ENCOUNTER — Ambulatory Visit
Admission: RE | Admit: 2014-09-27 | Discharge: 2014-09-27 | Disposition: A | Discharge status: Home / Self Care | Payer: PRIVATE HEALTH INSURANCE | Source: Ambulatory Visit | Attending: Advanced Practice Midwife | Admitting: Advanced Practice Midwife

## 2014-09-27 DIAGNOSIS — Z36 Encounter for antenatal screening of mother: Secondary | ICD-10-CM | POA: Insufficient documentation

## 2015-01-01 ENCOUNTER — Inpatient Hospital Stay: Payer: Medicaid Other | Admitting: Anesthesiology

## 2015-01-01 ENCOUNTER — Inpatient Hospital Stay
Admission: EM | Admit: 2015-01-01 | Discharge: 2015-01-03 | DRG: 775 | Disposition: A | Discharge status: Home / Self Care | Payer: Medicaid Other | Attending: Obstetrics and Gynecology | Admitting: Obstetrics and Gynecology

## 2015-01-01 DIAGNOSIS — O9913 Other diseases of the blood and blood-forming organs and certain disorders involving the immune mechanism complicating the puerperium: Secondary | ICD-10-CM | POA: Diagnosis not present

## 2015-01-01 DIAGNOSIS — Z3A39 39 weeks gestation of pregnancy: Secondary | ICD-10-CM | POA: Diagnosis not present

## 2015-01-01 DIAGNOSIS — D72829 Elevated white blood cell count, unspecified: Secondary | ICD-10-CM | POA: Diagnosis not present

## 2015-01-01 LAB — TYPE AND SCREEN
ABO/RH(D): A POS
Antibody Screen: NEGATIVE

## 2015-01-01 LAB — CHLAMYDIA/NGC RT PCR (ARMC ONLY)
CHLAMYDIA TR: NOT DETECTED
N gonorrhoeae: NOT DETECTED

## 2015-01-01 LAB — CBC
HCT: 38.1 % (ref 35.0–47.0)
HEMOGLOBIN: 12.2 g/dL (ref 12.0–16.0)
MCH: 28.9 pg (ref 26.0–34.0)
MCHC: 32.1 g/dL (ref 32.0–36.0)
MCV: 90.3 fL (ref 80.0–100.0)
PLATELETS: 240 10*3/uL (ref 150–440)
RBC: 4.22 MIL/uL (ref 3.80–5.20)
RDW: 15.5 % — ABNORMAL HIGH (ref 11.5–14.5)
WBC: 12.7 10*3/uL — AB (ref 3.6–11.0)

## 2015-01-01 LAB — RAPID HIV SCREEN (HIV 1/2 AB+AG)
HIV 1/2 Antibodies: NONREACTIVE
HIV-1 P24 Antigen - HIV24: NONREACTIVE

## 2015-01-01 LAB — ABO/RH: ABO/RH(D): A POS

## 2015-01-01 MED ORDER — LACTATED RINGERS IV SOLN
INTRAVENOUS | Status: DC
  Administered 2015-01-01 (×3): via INTRAVENOUS

## 2015-01-01 MED ORDER — ZOLPIDEM TARTRATE 5 MG PO TABS: 5.0000 mg | ORAL_TABLET | Freq: Every evening | ORAL | Status: DC | PRN

## 2015-01-01 MED ORDER — OXYTOCIN 40 UNITS IN LACTATED RINGERS INFUSION - SIMPLE MED
62.5000 mL/h | INTRAVENOUS | Status: DC
  Administered 2015-01-01: 62.5 mL/h via INTRAVENOUS

## 2015-01-01 MED ORDER — ACETAMINOPHEN 325 MG PO TABS: 650.0000 mg | ORAL_TABLET | ORAL | Status: DC | PRN

## 2015-01-01 MED ORDER — LIDOCAINE HCL (PF) 1 % IJ SOLN
30.0000 mL | INTRAMUSCULAR | Status: DC | PRN
  Filled 2015-01-01: qty 30

## 2015-01-01 MED ORDER — SIMETHICONE 80 MG PO CHEW: 80.0000 mg | CHEWABLE_TABLET | ORAL | Status: DC | PRN

## 2015-01-01 MED ORDER — BENZOCAINE-MENTHOL 20-0.5 % EX AERO: 1.0000 "application " | INHALATION_SPRAY | CUTANEOUS | Status: DC | PRN

## 2015-01-01 MED ORDER — LANOLIN HYDROUS EX OINT: TOPICAL_OINTMENT | CUTANEOUS | Status: DC | PRN

## 2015-01-01 MED ORDER — OXYCODONE-ACETAMINOPHEN 5-325 MG PO TABS: 1.0000 | ORAL_TABLET | ORAL | Status: DC | PRN

## 2015-01-01 MED ORDER — ONDANSETRON HCL 4 MG/2ML IJ SOLN: 4.0000 mg | Freq: Four times a day (QID) | INTRAMUSCULAR | Status: DC | PRN

## 2015-01-01 MED ORDER — ONDANSETRON HCL 4 MG PO TABS: 4.0000 mg | ORAL_TABLET | ORAL | Status: DC | PRN

## 2015-01-01 MED ORDER — OXYCODONE-ACETAMINOPHEN 5-325 MG PO TABS: 2.0000 | ORAL_TABLET | ORAL | Status: DC | PRN

## 2015-01-01 MED ORDER — BUPIVACAINE HCL (PF) 0.25 % IJ SOLN
INTRAMUSCULAR | Status: DC | PRN
  Administered 2015-01-01: 5 mL via EPIDURAL

## 2015-01-01 MED ORDER — FENTANYL 2.5 MCG/ML W/ROPIVACAINE 0.2% IN NS 100 ML EPIDURAL INFUSION (ARMC-ANES)
EPIDURAL | Status: AC
  Administered 2015-01-01: 10 mL/h via EPIDURAL
  Filled 2015-01-01: qty 100

## 2015-01-01 MED ORDER — BUTORPHANOL TARTRATE 1 MG/ML IJ SOLN: 1.0000 mg | INTRAMUSCULAR | Status: DC | PRN

## 2015-01-01 MED ORDER — CITRIC ACID-SODIUM CITRATE 334-500 MG/5ML PO SOLN: 30.0000 mL | ORAL | Status: DC | PRN

## 2015-01-01 MED ORDER — OXYTOCIN 40 UNITS IN LACTATED RINGERS INFUSION - SIMPLE MED
INTRAVENOUS | Status: AC
  Filled 2015-01-01: qty 1000

## 2015-01-01 MED ORDER — IBUPROFEN 600 MG PO TABS
600.0000 mg | ORAL_TABLET | Freq: Four times a day (QID) | ORAL | Status: DC
  Administered 2015-01-01: 600 mg via ORAL
  Filled 2015-01-01: qty 1

## 2015-01-01 MED ORDER — LIDOCAINE-EPINEPHRINE (PF) 1.5 %-1:200000 IJ SOLN
INTRAMUSCULAR | Status: DC | PRN
  Administered 2015-01-01: 2 mL via PERINEURAL

## 2015-01-01 MED ORDER — LACTATED RINGERS IV SOLN: 500.0000 mL | INTRAVENOUS | Status: DC | PRN

## 2015-01-01 MED ORDER — SODIUM CHLORIDE 0.9 % IV SOLN: 250.0000 mL | INTRAVENOUS | Status: DC | PRN

## 2015-01-01 MED ORDER — OXYTOCIN 40 UNITS IN LACTATED RINGERS INFUSION - SIMPLE MED
1.0000 m[IU]/min | INTRAVENOUS | Status: DC
  Administered 2015-01-01: 1 m[IU]/min via INTRAVENOUS

## 2015-01-01 MED ORDER — PRENATAL MULTIVITAMIN CH
1.0000 | ORAL_TABLET | Freq: Every day | ORAL | Status: DC
  Administered 2015-01-03: 1 via ORAL
  Filled 2015-01-01: qty 1

## 2015-01-01 MED ORDER — SENNOSIDES-DOCUSATE SODIUM 8.6-50 MG PO TABS
2.0000 | ORAL_TABLET | ORAL | Status: DC
  Administered 2015-01-03: 2 via ORAL
  Filled 2015-01-01: qty 2

## 2015-01-01 MED ORDER — MISOPROSTOL 200 MCG PO TABS
ORAL_TABLET | ORAL | Status: DC
Start: 2015-01-01 — End: 2015-01-01
  Filled 2015-01-01: qty 4

## 2015-01-01 MED ORDER — DIBUCAINE 1 % RE OINT: 1.0000 "application " | TOPICAL_OINTMENT | RECTAL | Status: DC | PRN

## 2015-01-01 MED ORDER — DIPHENHYDRAMINE HCL 25 MG PO CAPS: 25.0000 mg | ORAL_CAPSULE | Freq: Four times a day (QID) | ORAL | Status: DC | PRN

## 2015-01-01 MED ORDER — LACTATED RINGERS IV SOLN
INTRAVENOUS | Status: DC
  Administered 2015-01-01: 300 mL via INTRAUTERINE
  Administered 2015-01-01: 12:00:00 via INTRAUTERINE

## 2015-01-01 MED ORDER — IBUPROFEN 600 MG PO TABS
600.0000 mg | ORAL_TABLET | Freq: Four times a day (QID) | ORAL | Status: DC
  Administered 2015-01-02 – 2015-01-03 (×5): 600 mg via ORAL
  Filled 2015-01-01 (×6): qty 1

## 2015-01-01 MED ORDER — SODIUM CHLORIDE 0.9 % IJ SOLN
3.0000 mL | INTRAMUSCULAR | Status: DC | PRN
Start: 2015-01-01 — End: 2015-01-03

## 2015-01-01 MED ORDER — OXYTOCIN BOLUS FROM INFUSION
500.0000 mL | INTRAVENOUS | Status: DC
  Administered 2015-01-01: 500 mL via INTRAVENOUS

## 2015-01-01 MED ORDER — TETANUS-DIPHTH-ACELL PERTUSSIS 5-2.5-18.5 LF-MCG/0.5 IM SUSP: 0.5000 mL | Freq: Once | INTRAMUSCULAR | Status: DC

## 2015-01-01 MED ORDER — TERBUTALINE SULFATE 1 MG/ML IJ SOLN: 0.2500 mg | Freq: Once | INTRAMUSCULAR | Status: DC | PRN

## 2015-01-01 MED ORDER — AMMONIA AROMATIC IN INHA
RESPIRATORY_TRACT | Status: AC
  Filled 2015-01-01: qty 10

## 2015-01-01 MED ORDER — ONDANSETRON HCL 4 MG/2ML IJ SOLN: 4.0000 mg | INTRAMUSCULAR | Status: DC | PRN

## 2015-01-01 MED ORDER — SODIUM CHLORIDE 0.9 % IJ SOLN: 3.0000 mL | Freq: Two times a day (BID) | INTRAMUSCULAR | Status: DC

## 2015-01-01 MED ORDER — WITCH HAZEL-GLYCERIN EX PADS: 1.0000 "application " | MEDICATED_PAD | CUTANEOUS | Status: DC | PRN

## 2015-01-01 NOTE — Progress Notes (Signed)
OB ATTENDING  Patient with labor dystocia after AROM at 8am. SVE still 6/90/-2. Pitocin started.   Filed Vitals:   01/01/15 1015  BP: 107/61  Pulse: 97  Temp:   Resp: 16   EFM: 150, mod var, +accels, early decelerations - cat 1 tracing as all decelerations are early Toco: Q685min  SVE: IUPC placed without difficulty. 7/90/-2, LOA  Plan: Plan for pitocin titration to adequacy and then repeat exam.  Pt comfortable with epidural  Anticipate NSVD

## 2015-01-01 NOTE — Anesthesia Preprocedure Evaluation (Signed)
Anesthesia Evaluation  Patient identified by MRN, date of birth, ID band Patient awake    Reviewed: Allergy & Precautions, NPO status , Patient's Chart, lab work & pertinent test results  Airway Mallampati: II       Dental no notable dental hx.    Pulmonary neg pulmonary ROS,    Pulmonary exam normal        Cardiovascular negative cardio ROS   Rhythm:Regular Rate:Tachycardia     Neuro/Psych    GI/Hepatic negative GI ROS, Neg liver ROS,   Endo/Other  negative endocrine ROS  Renal/GU negative Renal ROS     Musculoskeletal   Abdominal Normal abdominal exam  (+)   Peds negative pediatric ROS (+)  Hematology negative hematology ROS (+)   Anesthesia Other Findings   Reproductive/Obstetrics                             Anesthesia Physical Anesthesia Plan  ASA: II  Anesthesia Plan: Epidural   Post-op Pain Management:    Induction:   Airway Management Planned: Nasal Cannula  Additional Equipment:   Intra-op Plan:   Post-operative Plan:   Informed Consent: I have reviewed the patients History and Physical, chart, labs and discussed the procedure including the risks, benefits and alternatives for the proposed anesthesia with the patient or authorized representative who has indicated his/her understanding and acceptance.     Plan Discussed with:   Anesthesia Plan Comments:         Anesthesia Quick Evaluation

## 2015-01-01 NOTE — H&P (Signed)
OB ATTENDING NOTE:  LMP: 03/28/14 EDD: 01/02/15  19yo G1P0 @ 39+6 here in labor  APC: ACHD 1. H/o CT - TOC 08/19/14, 3 months later neg   PMH: none Psurghx: none ALL: none  Labs; Hepbsag neg, A+, Utox neg, RI, VZV I, HIV neg, RPR neg,   O: Filed Vitals:   01/01/15 0152  Temp: 98.2 F (36.8 C)  Resp: 20    GEN: NAD ABD: soft, NT, vertex EXT: wwp  SVE: 4/100/bulging bag  EFM: 150 mod var +accels, no decels Toco: Q315min  A/P: 19yo G1P0 @ 39+6wks in labor. GBS neg 1. Admit to L&D 2. CBC, T&S, HIV, RPR 3. IV 4. Cont efm toco 5. Epidural prn  Recheck 4 hours or prn  Anticipate NSVD  Ala DachJohanna K Margareta Laureano, MD

## 2015-01-01 NOTE — Anesthesia Procedure Notes (Signed)
Epidural Patient location during procedure: OB Start time: 01/01/2015 4:03 AM End time: 01/01/2015 4:27 AM  Staffing Anesthesiologist: Elijio MilesVAN STAVEREN, Raimundo Corbit F Performed by: anesthesiologist   Preanesthetic Checklist Completed: patient identified, site marked, surgical consent, pre-op evaluation, timeout performed, IV checked, risks and benefits discussed, monitors and equipment checked and at surgeon's request  Epidural Patient position: sitting Prep: Betadine Patient monitoring: heart rate and blood pressure Approach: midline Location: L3-L4 Injection technique: LOR air and hanging drop  Needle:  Needle type: Tuohy  Needle gauge: 18 G Needle length: 9 cm Needle insertion depth: 4 cm Catheter type: closed end flexible Catheter size: 20 Guage Catheter at skin depth: 9 cm Test dose: negative and 2% lidocaine with Epi 1:200 K  Assessment Sensory level: T8  Additional Notes Reason for block:at surgeon's request

## 2015-01-02 LAB — CBC
HCT: 34.6 % — ABNORMAL LOW (ref 35.0–47.0)
Hemoglobin: 11.2 g/dL — ABNORMAL LOW (ref 12.0–16.0)
MCH: 29.4 pg (ref 26.0–34.0)
MCHC: 32.5 g/dL (ref 32.0–36.0)
MCV: 90.6 fL (ref 80.0–100.0)
PLATELETS: 224 10*3/uL (ref 150–440)
RBC: 3.83 MIL/uL (ref 3.80–5.20)
RDW: 15.4 % — ABNORMAL HIGH (ref 11.5–14.5)
WBC: 17 10*3/uL — AB (ref 3.6–11.0)

## 2015-01-02 LAB — RPR: RPR: NONREACTIVE

## 2015-01-02 NOTE — Anesthesia Postprocedure Evaluation (Signed)
Anesthesia Post Note  Patient: Jane BonesGabriela Villegas Brewer  Procedure(s) Performed: * No procedures listed *  Patient location during evaluation: Mother Baby Anesthesia Type: Epidural Level of consciousness: awake and alert Pain management: pain level controlled Vital Signs Assessment: post-procedure vital signs reviewed and stable Respiratory status: spontaneous breathing, nonlabored ventilation and respiratory function stable Cardiovascular status: stable Postop Assessment: No headache and No backache Anesthetic complications: no    Last Vitals:  Filed Vitals:   01/02/15 1326 01/02/15 1758  BP: 110/66 113/70  Pulse: 85 106  Temp: 36.7 C 36.9 C  Resp: 18 18    Last Pain:  Filed Vitals:   01/02/15 1800  PainSc: 0-No pain                 Cleda MccreedyJoseph K Piscitello

## 2015-01-02 NOTE — Progress Notes (Signed)
Post Partum Day 1 Subjective: no complaints, up ad lib, voiding, tolerating PO and + flatus  Objective: Blood pressure 109/63, pulse 86, temperature 98.4 F (36.9 C), temperature source Oral, resp. rate 18, height 5\' 2"  (1.575 m), weight 86.183 kg (190 lb), last menstrual period 03/28/2014, SpO2 100 %, unknown if currently breastfeeding.  Physical Exam:  General: alert, cooperative and no distress Lochia: appropriate Uterine Fundus: firm Incision: none DVT Evaluation: No evidence of DVT seen on physical exam.   Recent Labs  01/01/15 0219 01/02/15 0426  HGB 12.2 11.2*  HCT 38.1 34.6*    Assessment/Plan: Plan for discharge tomorrow, Breastfeeding and Contraception Implanon postpartum visit   LOS: 1 day   Jane Brewer 01/02/2015, 11:16 AM

## 2015-01-02 NOTE — Anesthesia Post-op Follow-up Note (Signed)
  Anesthesia Pain Follow-up Note  Patient: Jane Brewer  Day #: 1  Date of Follow-up: 01/02/2015 Time: 6:27 PM  Last Vitals:  Filed Vitals:   01/02/15 1326 01/02/15 1758  BP: 110/66 113/70  Pulse: 85 106  Temp: 36.7 C 36.9 C  Resp: 18 18    Level of Consciousness: alert  Pain: mild   Side Effects:None  Catheter Site Exam:clean, dry, no drainage  Plan: D/C from anesthesia care  Cleda MccreedyJoseph K Rayneisha Bouza

## 2015-01-03 LAB — URINALYSIS COMPLETE WITH MICROSCOPIC (ARMC ONLY)
Bacteria, UA: NONE SEEN
Bilirubin Urine: NEGATIVE
Glucose, UA: NEGATIVE mg/dL
KETONES UR: NEGATIVE mg/dL
NITRITE: NEGATIVE
PH: 7 (ref 5.0–8.0)
PROTEIN: NEGATIVE mg/dL
SPECIFIC GRAVITY, URINE: 1.003 — AB (ref 1.005–1.030)

## 2015-01-03 LAB — CBC WITH DIFFERENTIAL/PLATELET
BASOS ABS: 0 10*3/uL (ref 0–0.1)
Basophils Relative: 0 %
EOS ABS: 0.1 10*3/uL (ref 0–0.7)
EOS PCT: 1 %
HCT: 31.2 % — ABNORMAL LOW (ref 35.0–47.0)
HEMOGLOBIN: 10.1 g/dL — AB (ref 12.0–16.0)
LYMPHS ABS: 2.4 10*3/uL (ref 1.0–3.6)
LYMPHS PCT: 17 %
MCH: 29.4 pg (ref 26.0–34.0)
MCHC: 32.3 g/dL (ref 32.0–36.0)
MCV: 90.9 fL (ref 80.0–100.0)
Monocytes Absolute: 0.4 10*3/uL (ref 0.2–0.9)
Monocytes Relative: 3 %
NEUTROS PCT: 79 %
Neutro Abs: 11 10*3/uL — ABNORMAL HIGH (ref 1.4–6.5)
PLATELETS: 237 10*3/uL (ref 150–440)
RBC: 3.43 MIL/uL — AB (ref 3.80–5.20)
RDW: 16.2 % — ABNORMAL HIGH (ref 11.5–14.5)
WBC: 14 10*3/uL — AB (ref 3.6–11.0)

## 2015-01-03 NOTE — Discharge Instructions (Signed)
Parto vaginal, Cuidados posteriores  °(Vaginal Delivery, Care After) °Siga estas instrucciones durante las próximas semanas. Estas indicaciones para el alta le proporcionan información general acerca de cómo deberá cuidarse después del parto. El médico también podrá darle instrucciones específicas. El tratamiento ha sido planificado según las prácticas médicas actuales, pero en algunos casos pueden ocurrir problemas. Comuníquese con el médico si tiene algún problema o tiene preguntas al volver a su casa.  °INSTRUCCIONES PARA EL CUIDADO EN EL HOGAR  °· Tome sólo medicamentos de venta libre o recetados, según las indicaciones del médico o del farmacéutico. °· No beba alcohol, especialmente si está amamantando o toma analgésicos. °· No mastique tabaco ni fume. °· No consuma drogas. °· Continúe con un adecuado cuidado perineal. El buen cuidado perineal incluye: °¨ Higienizarse de adelante hacia atrás. °¨ Mantener la zona perineal limpia. °· No use tampones ni duchas vaginales hasta que su médico la autorice. °· Dúchese, lávese el cabello y tome baños de inmersión según las indicaciones de su médico. °· Utilice un sostén que le ajuste bien y que brinde buen soporte a sus mamas. °· Consuma alimentos saludables. °· Beba suficiente líquido para mantener la orina clara o de color amarillo pálido. °· Consuma alimentos ricos en fibra como cereales y panes integrales, arroz, frijoles y frutas y verduras frescas todos los días. Estos alimentos pueden ayudarla a prevenir o aliviar el estreñimiento. °· Siga las recomendaciones de su médico relacionadas con la reanudación de actividades como subir escaleras, conducir automóviles, levantar objetos, hacer ejercicios o viajar. °· Hable con su médico acerca de reanudar la actividad sexual. Volver a la actividad sexual depende del riesgo de infección, la velocidad de la curación y la comodidad y su deseo de reanudarla. °· Trate de que alguien la ayude con las actividades del hogar y con  el recién nacido al menos durante un par de días después de salir del hospital. °· Descanse todo lo que pueda. Trate de descansar o tomar una siesta mientras el bebé está durmiendo. °· Aumente sus actividades gradualmente. °· Cumpla con todas las visitas de control programadas para después del parto. Es muy importante asistir a todas las citas programadas de seguimiento. En estas citas, su médico va a controlarla para asegurarse de que esté sanando física y emocionalmente. °SOLICITE ATENCIÓN MÉDICA SI:  °· Elimina coágulos grandes por la vagina. Guarde algunos coágulos para mostrarle al médico. °· Tiene una secreción con feo olor que proviene de la vagina. °· Tiene dificultad para orinar. °· Orina con frecuencia. °· Siente dolor al orinar. °· Nota un cambio en sus movimientos intestinales. °· Aumenta el enrojecimiento, el dolor o la hinchazón en la zona de la incisión vaginal (episiotomía) o el desgarro vaginal. °· Tiene pus que drena por la episiotomía o el desgarro vaginal. °· La episiotomía o el desgarro vaginal se abren. °· Sus mamas le duelen, están duras o enrojecidas. °· Sufre un dolor intenso de cabeza. °· Tiene visión borrosa o ve manchas. °· Se siente triste o deprimida. °· Tiene pensamientos acerca de lastimarse o dañar al recién nacido. °· Tiene preguntas acerca de su cuidado personal, el cuidado del recién nacido o acerca de los medicamentos. °· Se siente mareada o sufre un desmayo. °· Tiene una erupción. °· Tiene náuseas o vómitos. °· Usted amamantó al bebé y no ha tenido su período menstrual dentro de las 12 semanas después de dejar de amamantar. °· No amamanta al bebé y no tuvo su período menstrual en las últimas 12° semanas después del   partoLance Muss.  Tiene fiebre. SOLICITE ATENCIN MDICA DE INMEDIATO SI:   Siente dolor persistente.  Siente dolor en el pecho.  Le falta el aire.  Se desmaya.  Siente dolor en la pierna.  Siente Physiological scientistdolor en el estmago.  El sangrado vaginal satura dos o ms  apsitos en 1 hora.   Esta informacin no tiene Theme park managercomo fin reemplazar el consejo del mdico. Asegrese de hacerle al mdico cualquier pregunta que tenga.   Document Released: 01/29/2005 Document Revised: 10/20/2014 Elsevier Interactive Patient Education [employer]2016 Else[employer] Inc.    Care After Vaginal Delivery Congratulations on your new baby!!  Refer to this sheet in the next few weeks. These discharge instructions provide you with information on caring for yourself after delivery. Your caregiver may also give you specific instructions. Your treatment has been planned according to the most current medical practices available, but problems sometimes occur. Call your caregiver if you have any problems or questions after you go home.  HOME CARE INSTRUCTIONS  Take over-the-counter or prescription medicines only as directed by your caregiver or pharmacist.  Do not drink alcohol, especially if you are breastfeeding or taking medicine to relieve pain.  Do not chew or smoke tobacco.  Do not use illegal drugs.  Continue to use good perineal care. Good perineal care includes:  Wiping your perineum from front to back.  Keeping your perineum clean.  Do not use tampons or douche until your caregiver says it is okay.  Shower, wash your hair, and take tub baths as directed by your caregiver.  Wear a well-fitting bra that provides breast support.  Eat healthy foods.  Drink enough fluids to keep your urine clear or pale yellow.  Eat high-fiber foods such as whole grain cereals and breads, brown rice, beans, and fresh fruits and vegetables every day. These foods may help prevent or relieve constipation.  Follow your caregiver's recommendations regarding resumption of activities such as climbing stairs, driving, lifting, exercising, or traveling. Specifically, no driving for two weeks, so that you are comfortable reacting quickly in an emergency.  Talk to your caregiver about resuming sexual  activities. Resumption of sexual activities is dependent upon your risk of infection, your rate of healing, and your comfort and desire to resume sexual activity. Usually we recommend waiting about six weeks, or until your bleeding stops and you are interested in sex.  Try to have someone help you with your household activities and your newborn for at least a few days after you leave the hospital. Even longer is better.  Rest as much as possible. Try to rest or take a nap when your newborn is sleeping. Sleep deprivation can be very hard after delivery.  Increase your activities gradually.  Keep all of your scheduled postpartum appointments. It is very important to keep your scheduled follow-up appointments. At these appointments, your caregiver will be checking to make sure that you are healing physically and emotionally.  SEEK MEDICAL CARE IF:   You are passing large clots from your vagina.   You have a foul smelling discharge from your vagina.  You have trouble urinating.  You are urinating frequently.  You have pain when you urinate.  You have a change in your bowel movements.  You have increasing redness, pain, or swelling near your vaginal incision (episiotomy) or vaginal tear.  You have pus draining from your episiotomy or vaginal tear.  Your episiotomy or vaginal tear is separating.  You have painful, hard, or reddened breasts.  You have a severe  headache.  You have blurred vision or see spots.  You feel sad or depressed.  You have thoughts of hurting yourself or your newborn.  You have questions about your care, the care of your newborn, or medicines.  You are dizzy or light-headed.  You have a rash.  You have nausea or vomiting.  You were breastfeeding and have not had a menstrual period within 12 weeks after you stopped breastfeeding.  You are not breastfeeding and have not had a menstrual period by the 12th week after delivery.  You have a fever.  SEEK  IMMEDIATE MEDICAL CARE IF:   You have persistent pain.  You have chest pain.  You have shortness of breath.  You faint.  You have leg pain.  You have stomach pain.  Your vaginal bleeding saturates two or more sanitary pads in 1 hour.  MAKE SURE YOU:   Understand these instructions.  Will get help right away if you are not doing well or get worse.   Document Released: 01/27/2000 Document Revised: 06/15/2013 Document Reviewed: 09/26/2011  [facility] Patient Information 2015 [place], [place]. This information is not intended to replace advice given to you by your health care provider. Make sure you discuss any questions you have with your health care provider. Follow up with [facility] at 6 weeks

## 2015-01-03 NOTE — Progress Notes (Signed)
All discharge instructions reviewed with pt with interpreter present; pt discharged at this time via wheelchair escorted by hospital volunteers

## 2015-01-03 NOTE — Discharge Summary (Signed)
Obstetric Discharge Summary   Patient ID: Jane Brewer MRN: [id number] DOB/AGE: 18-Oct-1995 19 y.o.  Interpreter called for evaluation. Pt has elevated WBC of 17 this am. Repeat of 14.  Date of Admission: 01/01/2015  Date of Discharge: 01/03/15  Admitting Diagnosis: Onset of Labor at 6053w6d  Secondary Diagnosis: Elevated WBC's. On PPD#2  Mode of Delivery: normal spontaneous vaginal delivery  Discharge Diagnosis: Elevated WBC's now normalizing  Intrapartum Procedures: NSVD with 2nd degree lac with repair  Post partum procedures: none  Complications: 2nd degree lac degree perineal laceration   Brief Hospital Course  Jane Brewer is a G1P1001 who had a SVD on 01/01/15; for further details of this delivery, please refer to the delivery note. Patient had an uncomplicated postpartum course. By time of discharge on PPD#2, her pain was controlled on oral pain medications; she had appropriate lochia and was ambulating, voiding without difficulty and tolerating regular diet. She was deemed stable for discharge to home.    Labs: CBC Latest Ref Rng 01/02/2015 01/01/2015 05/26/2014  WBC 3.6 - 11.0 K/uL 17.0(H) 12.7(H) 15.7(H)  Hemoglobin 12.0 - 16.0 g/dL 11.2(L) 12.2 12.8  Hematocrit 35.0 - 47.0 % 34.6(L) 38.1 38.1  Platelets 150 - 440 K/uL 224 240 341   A POS  Physical exam:  Blood pressure 97/45, pulse 79, temperature 98.3 F (36.8 C), temperature source Oral, resp. rate 20, height 5\' 2"  (1.575 m), weight 190 lb (86.183 kg), last menstrual period 03/28/2014, SpO2 100 %, unknown if currently breastfeeding. General: alert and no distress. Denies any abd pain, urinary pain or problems having BM's.  Lochia: appropriate Abdomen: soft, NT,+BS present. No pain with palpation.  Uterine Fundus: firm, 1+ umbilicus Extremities: No evidence of DVT seen on physical exam. No  lower extremity edema.  Discharge Instructions: Per After Visit Summary. Activity: Advance as tolerated. Pelvic rest for 6 weeks. Also refer to After Visit Summary Diet: Regular Medications:   Medication List    ASK your doctor about these medications       prenatal multivitamin Tabs tablet  Take 1 tablet by mouth daily at 12 noon.       Outpatient follow up:  Postpartum contraception:Nexplanon at 6 weeks pp  Discharged Condition: Stable  Discharged to: Home   Newborn Data:  Baby Boy named "Aiden"  Disposition:home with mother  Apgars: APGAR (1 MIN): 8  APGAR (5 MINS): 9  APGAR (10 MINS):   Baby Feeding: Breast/Bottle  Sharee Pimplearon W Tekia [place], CNM 01/03/2015

## 2015-01-03 NOTE — Progress Notes (Signed)
Notify Arlyss RepressBeth Hawkins, Rn of bp

## 2015-01-03 NOTE — Progress Notes (Signed)
Obstetric Discharge Summary   Patient ID: Jane BonesGabriela Villegas Brewer MRN: [id number] DOB/AGE: Mar 22, 1995 19 y.o.  Interpreter called for evaluation. Pt has elevated WBC of 17 this am.  Date of Admission: 01/01/2015  Date of Discharge: 01/03/15  Admitting Diagnosis: Onset of Labor at 737w6d  Secondary Diagnosis: Elevated WBC's. On PPD#2  Mode of Delivery: normal spontaneous vaginal delivery     Discharge Diagnosis: Elevated WBC's   Intrapartum Procedures: NSVD with 2nd degree lac with repair   Post partum procedures: none  Complications: 2nd degree lac degree perineal laceration   Brief Hospital Course  Jane Brewer is a G1P1001 who had a SVD on 01/01/15;  for further details of this delivery, please refer to the delivery note.  Patient had an uncomplicated postpartum course.  By time of discharge on PPD#2, her pain was controlled on oral pain medications; she had appropriate lochia and was ambulating, voiding without difficulty and tolerating regular diet.  She was deemed stable for discharge to home.     Labs: CBC Latest Ref Rng 01/02/2015 01/01/2015 05/26/2014  WBC 3.6 - 11.0 K/uL 17.0(H) 12.7(H) 15.7(H)  Hemoglobin 12.0 - 16.0 g/dL 11.2(L) 12.2 12.8  Hematocrit 35.0 - 47.0 % 34.6(L) 38.1 38.1  Platelets 150 - 440 K/uL 224 240 341   A POS  Physical exam:  Blood pressure 97/45, pulse 79, temperature 98.3 F (36.8 C), temperature source Oral, resp. rate 20, height 5\' 2"  (1.575 m), weight 190 lb (86.183 kg), last menstrual period 03/28/2014, SpO2 100 %, unknown if currently breastfeeding. General: alert and no distress. Denies any abd pain, urinary pain or problems having BM's.  Lochia: appropriate Abdomen: soft, NT,+BS present. No pain with palpation.  Uterine Fundus: firm, 1+ umbilicus Extremities: No evidence of DVT seen on physical exam. No lower extremity edema.  Discharge Instructions: Per After Visit Summary. Activity: Advance as tolerated. Pelvic  rest for 6 weeks.  Also refer to After Visit Summary Diet: Regular Medications:   Medication List    ASK your doctor about these medications        prenatal multivitamin Tabs tablet  Take 1 tablet by mouth daily at 12 noon.       Outpatient follow up:  Postpartum contraception:Nexplanon at 6 weeks pp  Discharged Condition: Stable  Discharged to: Home   Newborn Data:  Baby Boy  named "Jane Brewer"  Disposition:home with mother  Apgars: APGAR (1 MIN): 8   APGAR (5 MINS): 9   APGAR (10 MINS):    Baby Feeding: Breast/Bottle  Sharee Pimplearon W Temesgen Weightman, CNM 01/03/2015

## 2015-01-05 LAB — URINE CULTURE

## 2017-10-02 IMAGING — US US OB TRANSVAGINAL
1 series · 13 of 28 positions shown · non-contrast
Comparison: None.

CLINICAL DATA: Vaginal bleeding. Beta HCG 675. Gestational age by
last menstrual period 7 weeks and 4 days.

EXAM:
OBSTETRIC <14 WK US AND TRANSVAGINAL OB US
TECHNIQUE: Both transabdominal and transvaginal ultrasound examinations were
performed for complete evaluation of the gestation as well as the
maternal uterus, adnexal regions, and pelvic cul-de-sac.
Transvaginal technique was performed to assess early pregnancy.

[Series 1: us ob transvaginal · 0.14mm/px · 13 of 79 slices shown]
[im 3/79]
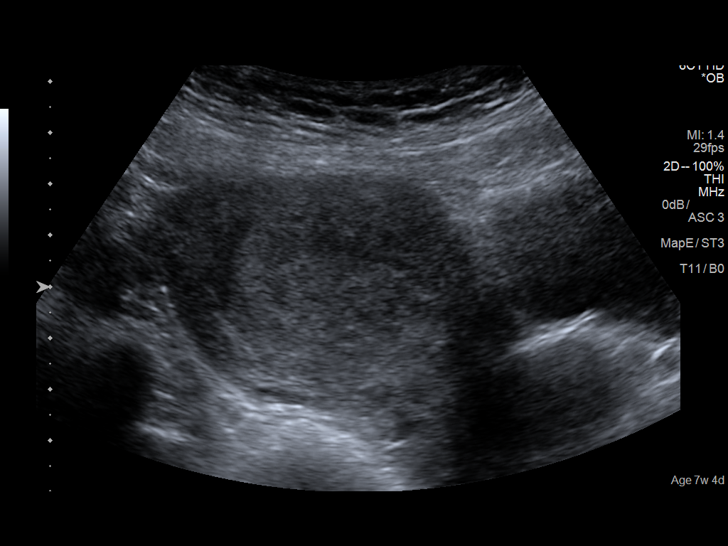
[im 9/79]
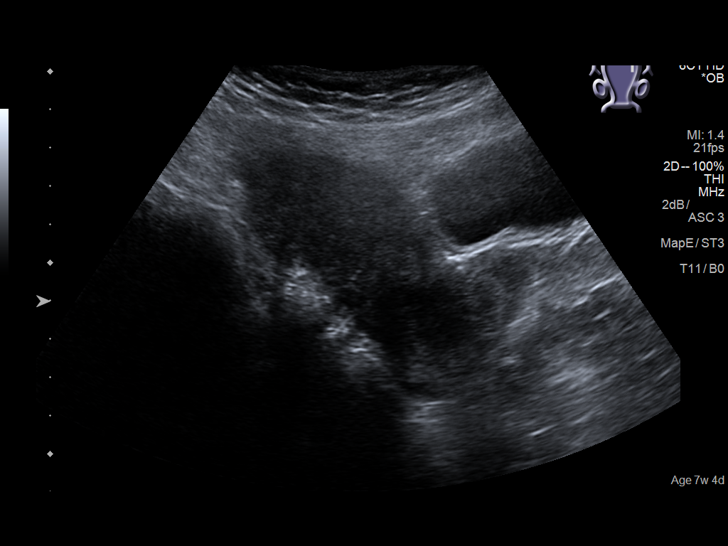
[im 15/79]
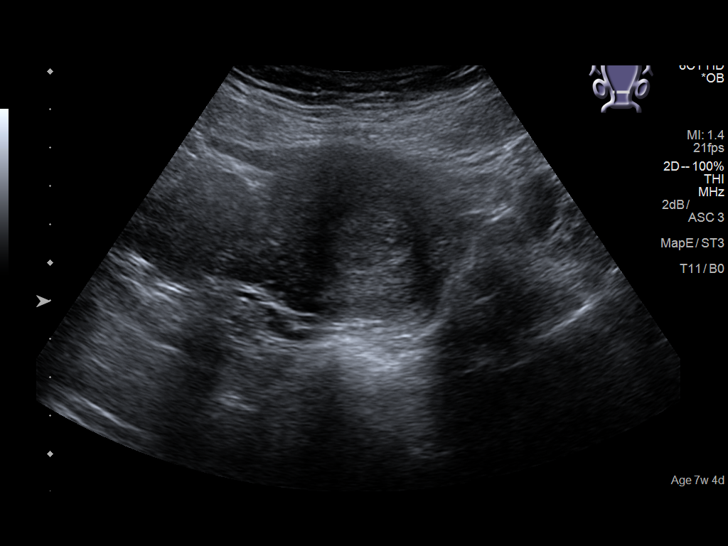
[im 21/79]
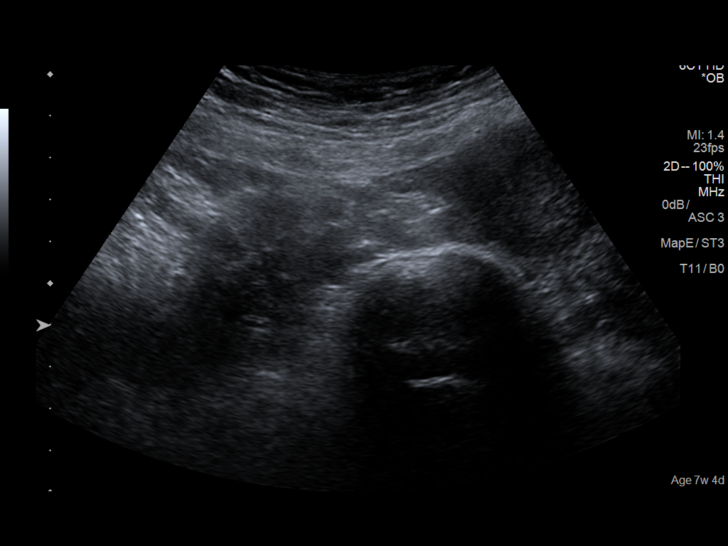
[im 27/79]
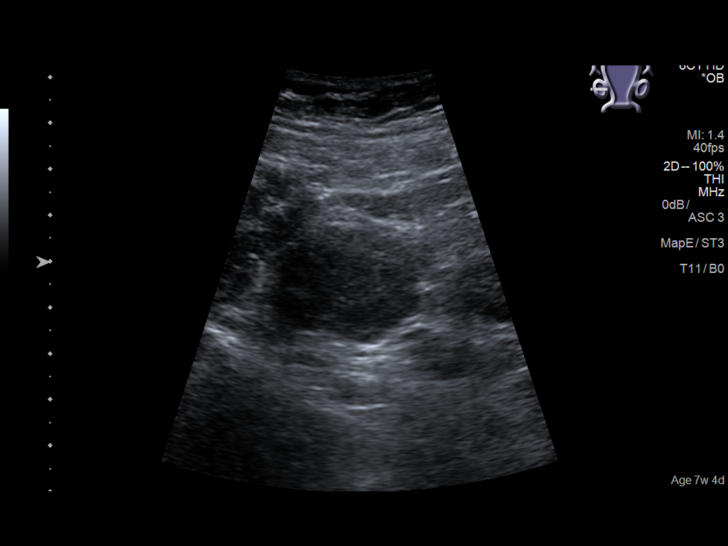
[im 32/79]
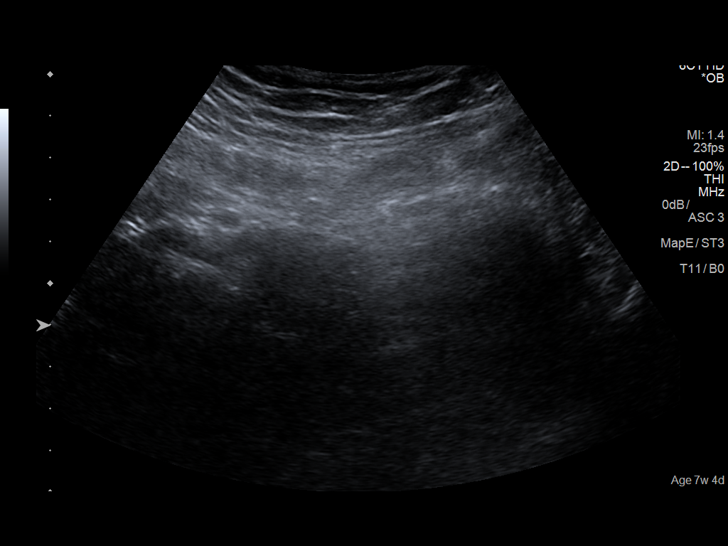
[im 41/79]
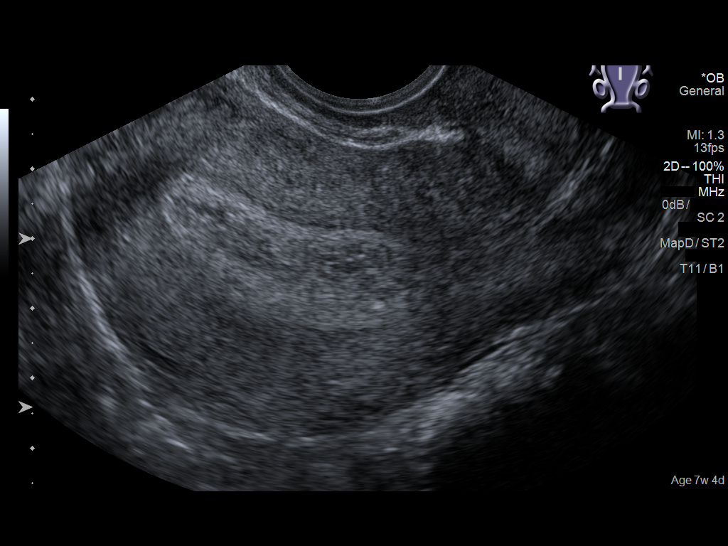
[im 47/79]
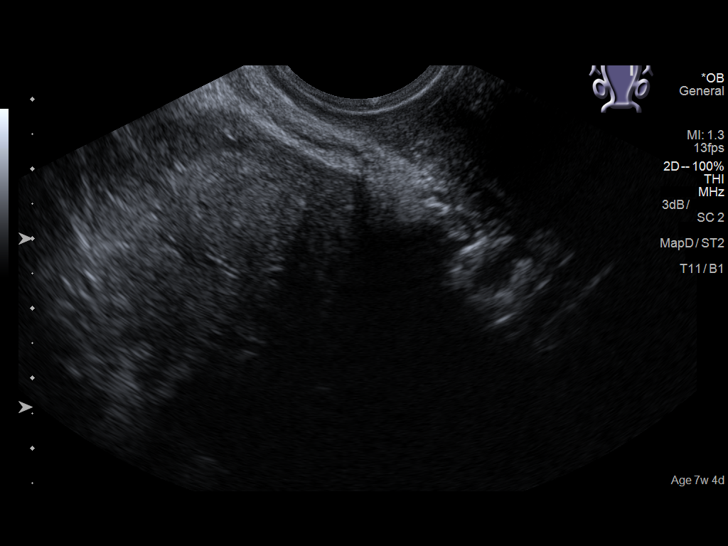
[im 53/79]
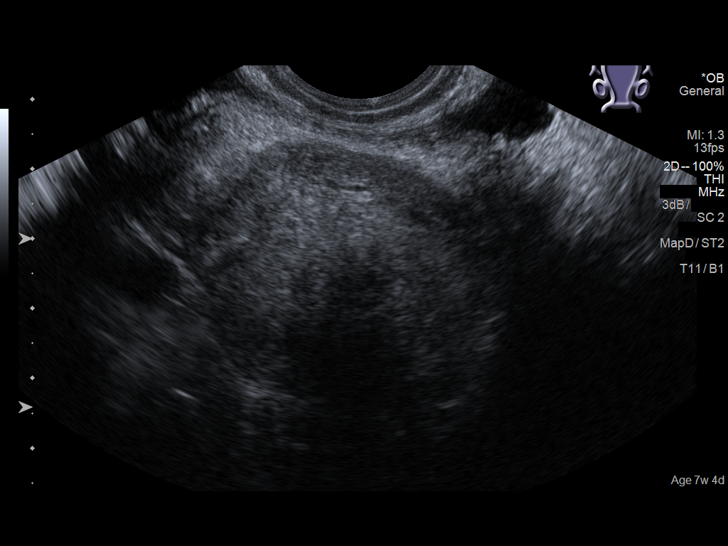
[im 58/79]
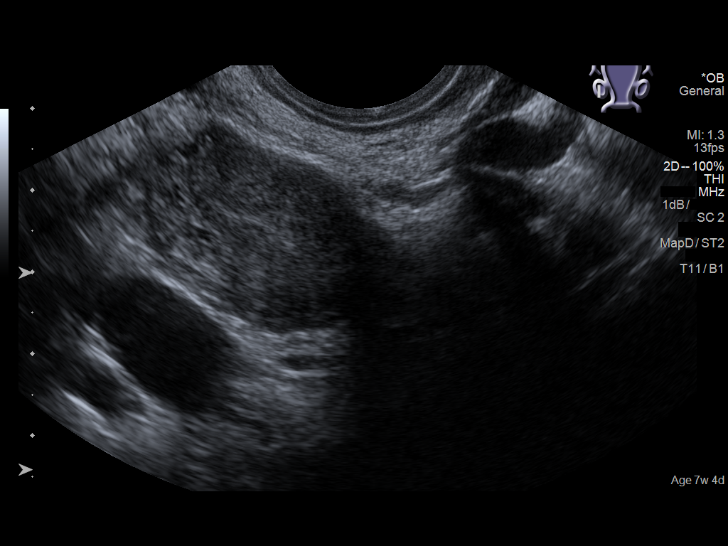
[im 64/79]
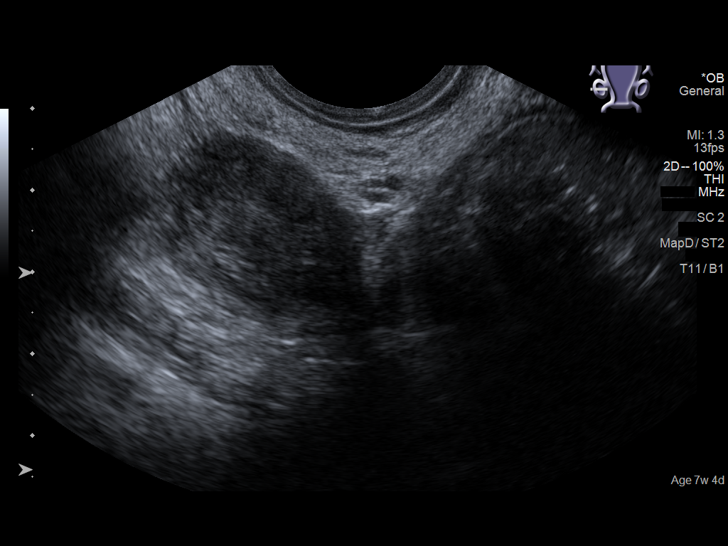
[im 70/79]
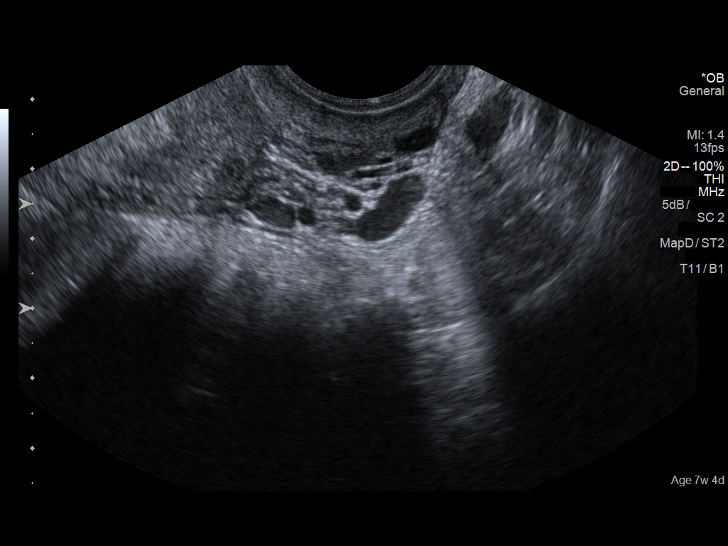
[im 76/79]
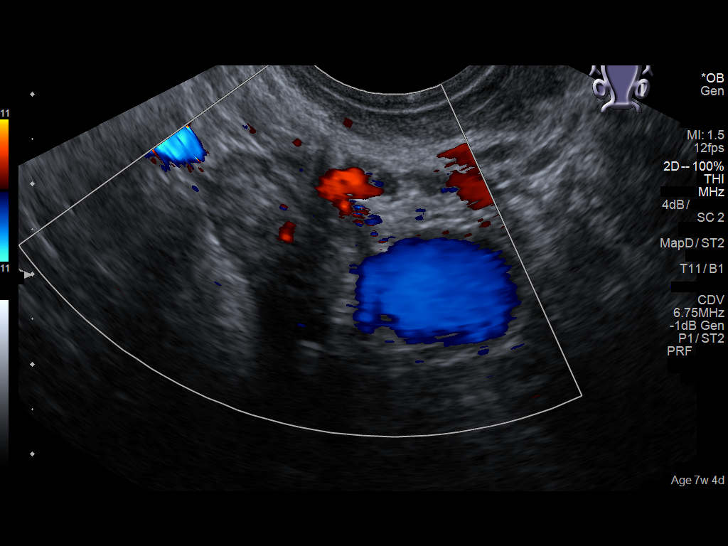

[13 of 28 positions shown; findings below may reference images not displayed]

FINDINGS: Intrauterine gestational sac: None.

Yolk sac:  None.

Embryo:  None.

Cardiac Activity: None.

Subchorionic hemorrhage:  None visualized.

Maternal uterus/adnexae: Thickened 19 mm endometrium with echogenic
avascular fluid. Normal appearance of the adnexa. No free fluid.
IMPRESSION: 1. No sonographically identified IUP. Pregnancy of unknown anatomic
location (no intrauterine gestational sac or adnexal mass
identified). Differential diagnosis includes recent spontaneous
miscarriage, IUP too early to visualize, and non-visualized ectopic
pregnancy. Recommend correlation with serial beta-hCG levels, and
follow up US if warranted clinically. Please note, with beta HCG of
2. Avascular fluid within endometrium most compatible with blood
products, retained products of conception not excluded.

## 2017-10-31 ENCOUNTER — Emergency Department
Admission: EM | Admit: 2017-10-31 | Discharge: 2017-10-31 | Disposition: A | Discharge status: Home / Self Care | Payer: Medicaid Other | Attending: Emergency Medicine | Admitting: Emergency Medicine

## 2017-10-31 ENCOUNTER — Encounter: Payer: Self-pay | Admitting: Emergency Medicine

## 2017-10-31 ENCOUNTER — Other Ambulatory Visit: Payer: Self-pay

## 2017-10-31 ENCOUNTER — Emergency Department: Payer: Medicaid Other

## 2017-10-31 DIAGNOSIS — O200 Threatened abortion: Secondary | ICD-10-CM | POA: Diagnosis not present

## 2017-10-31 DIAGNOSIS — Z3A01 Less than 8 weeks gestation of pregnancy: Secondary | ICD-10-CM | POA: Insufficient documentation

## 2017-10-31 DIAGNOSIS — O209 Hemorrhage in early pregnancy, unspecified: Secondary | ICD-10-CM | POA: Diagnosis present

## 2017-10-31 LAB — URINALYSIS, COMPLETE (UACMP) WITH MICROSCOPIC
BILIRUBIN URINE: NEGATIVE
Bacteria, UA: NONE SEEN
Glucose, UA: NEGATIVE mg/dL
KETONES UR: NEGATIVE mg/dL
LEUKOCYTES UA: NEGATIVE
Nitrite: NEGATIVE
PROTEIN: NEGATIVE mg/dL
Specific Gravity, Urine: 1.002 — ABNORMAL LOW (ref 1.005–1.030)
Squamous Epithelial / LPF: NONE SEEN (ref 0–5)
pH: 7 (ref 5.0–8.0)

## 2017-10-31 LAB — CBC
HCT: 38.9 % (ref 35.0–47.0)
HEMOGLOBIN: 13.4 g/dL (ref 12.0–16.0)
MCH: 33.2 pg (ref 26.0–34.0)
MCHC: 34.6 g/dL (ref 32.0–36.0)
MCV: 95.9 fL (ref 80.0–100.0)
Platelets: 337 10*3/uL (ref 150–440)
RBC: 4.05 MIL/uL (ref 3.80–5.20)
RDW: 13.2 % (ref 11.5–14.5)
WBC: 12.5 10*3/uL — ABNORMAL HIGH (ref 3.6–11.0)

## 2017-10-31 LAB — HCG, QUANTITATIVE, PREGNANCY: HCG, BETA CHAIN, QUANT, S: 675 m[IU]/mL — AB (ref <5)

## 2017-10-31 LAB — POCT PREGNANCY, URINE: Preg Test, Ur: POSITIVE — AB

## 2017-10-31 NOTE — ED Triage Notes (Signed)
Patient to ER for c/o vaginal bleeding, currently [redacted] weeks pregnant. LMP 09/08/17. Patient reports bleeding is heavy with clots, bleeding started approx one hour ago. +Pelvic pain.   No AboRh ordered, previous result in chart.

## 2017-10-31 NOTE — ED Provider Notes (Signed)
[facility] Emergency Department Provider Note       Time seen: ----------------------------------------- 6:40 PM on 10/31/2017 -----------------------------------------   I have reviewed the triage vital signs and the nursing notes.  HISTORY   Chief Complaint Vaginal Bleeding    HPI Jane Brewer is a 22 y.o. female with no significant past medical history who presents to the ED for vaginal bleeding.  Patient states she is currently [redacted] weeks pregnant, has had bleeding as well as pelvic pain and passing large clots.  She has never had this happen before.  She denies any recent illness.  History reviewed. No pertinent past medical history.  Patient Active Problem List   Diagnosis Date Noted  . Normal labor and delivery 01/01/2015    History reviewed. No pertinent surgical history.  Allergies Patient has no known allergies.  Social History Social History   Tobacco Use  . Smoking status: Never Smoker  . Smokeless tobacco: Never Used  Substance Use Topics  . Alcohol use: No  . Drug use: No   Review of Systems Constitutional: Negative for fever. Cardiovascular: Negative for chest pain. Respiratory: Negative for shortness of breath. Gastrointestinal: Positive for pelvic pain Genitourinary: Positive for vaginal bleeding Musculoskeletal: Negative for back pain. Skin: Negative for rash. Neurological: Negative for headaches, focal weakness or numbness.  All systems negative/normal/unremarkable except as stated in the HPI  ____________________________________________   PHYSICAL EXAM:  VITAL SIGNS: ED Triage Vitals  Enc Vitals Group     BP 10/31/17 1758 138/84     Pulse Rate 10/31/17 1758 (!) 107     Resp 10/31/17 1758 20     Temp 10/31/17 1758 98.8 F (37.1 C)     Temp Source 10/31/17 1758 Oral     SpO2 10/31/17 1758 99 %     Weight 10/31/17 1806 138 lb (62.6 kg)     Height 10/31/17 1806 5' 2.99" (1.6 m)     Head  Circumference --      Peak Flow --      Pain Score 10/31/17 1806 7     Pain Loc --      Pain Edu? --      Excl. in GC? --    Constitutional: Alert and oriented. Well appearing and in no distress. Eyes: Conjunctivae are normal. Normal extraocular movements. Cardiovascular: Normal rate, regular rhythm. No murmurs, rubs, or gallops. Respiratory: Normal respiratory effort without tachypnea nor retractions. Breath sounds are clear and equal bilaterally. No wheezes/rales/rhonchi. Gastrointestinal: Soft and nontender. Normal bowel sounds Musculoskeletal: Nontender with normal range of motion in extremities. No lower extremity tenderness nor edema. Neurologic:  Normal speech and language. No gross focal neurologic deficits are appreciated.  Skin:  Skin is warm, dry and intact. No rash noted. Psychiatric: Mood and affect are normal. Speech and behavior are normal.  ____________________________________________  ED COURSE:  As part of my medical decision making, I reviewed the following data within the electronic MEDICAL RECORD NUMBER History obtained from family if available, nursing notes, old chart and ekg, as well as notes from prior ED visits. Patient presented for vaginal bleeding in early pregnancy, we will assess with labs and imaging as indicated at this time.   Procedures ____________________________________________   LABS (pertinent positives/negatives)  Labs Reviewed  HCG, QUANTITATIVE, PREGNANCY - Abnormal; Notable for the following components:      Result Value   hCG, Beta Chain, Quant, S 675 (*)    All other components within normal limits  CBC -  Abnormal; Notable for the following components:   WBC 12.5 (*)    All other components within normal limits  URINALYSIS, COMPLETE (UACMP) WITH MICROSCOPIC - Abnormal; Notable for the following components:   Color, Urine STRAW (*)    APPearance CLEAR (*)    Specific Gravity, Urine 1.002 (*)    Hgb urine dipstick LARGE (*)    All other  components within normal limits  POCT PREGNANCY, URINE - Abnormal; Notable for the following components:   Preg Test, Ur POSITIVE (*)    All other components within normal limits  POC URINE PREG, ED    RADIOLOGY  Pelvic ultrasound  IMPRESSION: 1. No sonographically identified IUP. Pregnancy of unknown anatomic location (no intrauterine gestational sac or adnexal mass identified). Differential diagnosis includes recent spontaneous miscarriage, IUP too early to visualize, and non-visualized ectopic pregnancy. Recommend correlation with serial beta-hCG levels, and follow up US if warranted clinically. Please note, with beta HCG of less than 3,000, pregnancy may not be sonographically evident. 2. Avascular fluid within endometrium most compatible with blood products, retained products of conception not excluded. ____________________________________________  DIFFERENTIAL DIAGNOSIS   Threatened miscarriage, ectopic, miscarriage, normal pregnancy, UTI  FINAL ASSESSMENT AND PLAN  Threatened miscarriage   Plan: The patient had presented for vaginal bleeding in early pregnancy. Patient's labs reveal a low quant.  Her blood type was A+ from previous lab work. Patient's imaging is concerning for likely miscarriage although we will have her follow-up in 2 days for repeat hCG level.  Given the fact there is a vascular fluid in the endometrium and she is passing large clots I think she is currently having a miscarriage.   Ulice DashJohnathan E [place] Spainhower, MD   Note: This note was generated in part or whole with voice recognition software. Voice recognition is usually quite accurate but there are transcription errors that can and very often do occur. I apologize for any typographical errors that were not detected and corrected.     Emily FilbertWilliams, Evalyse Stroope E, MD 10/31/17 2004

## 2017-11-02 ENCOUNTER — Encounter: Payer: Self-pay | Admitting: Emergency Medicine

## 2017-11-02 ENCOUNTER — Emergency Department
Admission: EM | Admit: 2017-11-02 | Discharge: 2017-11-02 | Disposition: A | Discharge status: Home / Self Care | Payer: Medicaid Other | Attending: Student in an Organized Health Care Education/Training Program | Admitting: Student in an Organized Health Care Education/Training Program

## 2017-11-02 ENCOUNTER — Other Ambulatory Visit: Payer: Self-pay

## 2017-11-02 DIAGNOSIS — O039 Complete or unspecified spontaneous abortion without complication: Secondary | ICD-10-CM

## 2017-11-02 DIAGNOSIS — Z3A00 Weeks of gestation of pregnancy not specified: Secondary | ICD-10-CM | POA: Insufficient documentation

## 2017-11-02 LAB — HCG, QUANTITATIVE, PREGNANCY: HCG, BETA CHAIN, QUANT, S: 69 m[IU]/mL — AB (ref <5)

## 2017-11-02 NOTE — ED Triage Notes (Addendum)
Here for repeat HCG.  Seen by dr Mayford Knifewilliams 2 days ago for vaginal bleeding during pregnancy that is likely to miscarriage.  Told to come today and have repeat HCG level drawn. G2P1. No new symptoms per pt, only here for blood test.

## 2017-11-02 NOTE — ED Provider Notes (Signed)
[facility] Emergency Department Provider Note  ____________________________________________   First MD Initiated Contact with Patient 11/02/17 1329     (approximate)  I have reviewed the triage vital signs and the nursing notes.   HISTORY  Chief Complaint repeat HCG only   HPI Jane Brewer is a 22 y.o. female patient returns to ED for repeat hCG.  She states that 2 days ago she had some cramping and yesterday states that there was less vaginal bleeding.  There is suspicion of a threatened miscarriage and today's blood test is to confirm that.  History reviewed. No pertinent past medical history.  Patient Active Problem List   Diagnosis Date Noted  . Normal labor and delivery 01/01/2015    History reviewed. No pertinent surgical history.  Prior to Admission medications   Medication Sig Start Date End Date Taking? Authorizing Provider  Prenatal Vit-Fe Fumarate-FA (PRENATAL MULTIVITAMIN) TABS tablet Take 1 tablet by mouth daily at 12 noon.    [provider]    Allergies Patient has no known allergies.  History reviewed. No pertinent family history.  Social History Social History   Tobacco Use  . Smoking status: Never Smoker  . Smokeless tobacco: Never Used  Substance Use Topics  . Alcohol use: No  . Drug use: No    Review of Systems Constitutional: No fever/chills Cardiovascular: Denies chest pain. Respiratory: Denies shortness of breath. Gastrointestinal: No abdominal pain.  No nausea, no vomiting. Genitourinary: Positive vaginal bleeding. Musculoskeletal: Negative for back pain. Neurological: Negative for headaches, focal weakness or numbness. ___________________________________________   PHYSICAL EXAM:  VITAL SIGNS: ED Triage Vitals  Enc Vitals Group     BP 11/02/17 1312 125/64     Pulse Rate 11/02/17 1312 68     Resp 11/02/17 1312 14     Temp 11/02/17 1312 98.5 F (36.9 C)     Temp Source  11/02/17 1312 Oral     SpO2 11/02/17 1312 100 %     Weight 11/02/17 1313 137 lb 12.6 oz (62.5 kg)     Height 11/02/17 1313 5\' 2"  (1.575 m)     Head Circumference --      Peak Flow --      Pain Score 11/02/17 1313 0     Pain Loc --      Pain Edu? --      Excl. in GC? --    Constitutional: Alert and oriented. Well appearing and in no acute distress. Eyes: Conjunctivae are normal.  Head: Atraumatic. Neck: No stridor.   Respiratory: Normal respiratory effort.  No retractions.  Musculoskeletal: Moves upper and lower extremities without any difficulty.  Normal gait noted. Neurologic:  Normal speech and language. No gross focal neurologic deficits are appreciated. No gait instability. Skin:  Skin is warm, dry and intact. Psychiatric: Mood and affect are normal. Speech and behavior are normal.  ____________________________________________   LABS (all labs ordered are listed, but only abnormal results are displayed)  Labs Reviewed  HCG, QUANTITATIVE, PREGNANCY - Abnormal; Notable for the following components:      Result Value   hCG, Beta Chain, Quant, S 69 (*)    All other components within normal limits    PROCEDURES  Procedure(s) performed: None  Procedures  Critical Care performed: No  ____________________________________________   INITIAL IMPRESSION / ASSESSMENT AND PLAN / ED COURSE  As part of my medical decision making, I reviewed the following data within the electronic MEDICAL RECORD NUMBER Notes from prior ED  visits and [place] Controlled Substance Database  Presents to the ED for repeat of her hCG.  Patient was in the ED 2 days ago with vaginal bleeding.  Spanish interpreter was called in the event that there was any confusion or questions to be asked.  Lab results today was explained to the patient and that her hCG is actually falling down in relationship to what it was 2 days ago which confirms that this is a spontaneous abortion.  Patient is encouraged to follow-up with her  PCP or the [facility] department where she was going to have her prenatal visits.  She is to return to the emergency department if any severe worsening of her symptoms.  ____________________________________________   FINAL CLINICAL IMPRESSION(S) / ED DIAGNOSES  Final diagnoses:  Spontaneous miscarriage     ED Discharge Orders    None       Note:  This document was prepared using Dragon voice recognition software and may include unintentional dictation errors.    Tommi Rumps, PA-C 11/02/17 1548    Willy Eddy, MD 11/03/17 1005

## 2017-11-02 NOTE — ED Notes (Signed)
Patient states she still has vaginal bleeding, but has lessened in the last two days.

## 2017-11-02 NOTE — Discharge Instructions (Signed)
Follow-up with the prenatal clinic [facility].  Call Monday for an appointment. Return to the emergency department if any severe worsening of your symptoms such as severe pain or severe bleeding.

## 2017-11-02 NOTE — ED Notes (Signed)
Pt was seen by this nurse and dr. Mayford KnifeWilliams on Thursday for vaginal bleeding at [redacted] weeks pregnant. Was told to come back here in two days for a redraw on her hcg levels to confirm miscarrage. Pt does not have an obgyn.

## 2018-02-12 NOTE — L&D Delivery Note (Signed)
Date of delivery: 11/27/2018 Estimated Date of Delivery: 12/08/18 Patient's last menstrual period was 03/03/2018. EGA: [redacted]w[redacted]d  Delivery Note At 4:38 AM a viable female was delivered via Vaginal, Spontaneous (Presentation: cephalic; direct OA).  APGAR: 9, 10; weight  Pending skin-to-skin.   Placenta status: intact, spotnatneous.  Central insertion, full disk, no missing or disrupted cotyledons, small infarcts <1% of placental disk. Grossly normal size for EGA.   Cord: 3vv with the following complications: none apparent.  Cord pH: not collected.  Anesthesia:  epidural Episiotomy: None Lacerations: None Suture Repair: n/a Est. Blood Loss (mL): 40cc  Mom presented to L&D with labor.  epidual placed. Progressed to complete, second stage: 10 minutes.  delivery of fetal head with restitution to ROT.   Anterior then posterior shoulders delivered without difficulty.  Baby placed on mom's chest, and attended to by peds.  Cord was then clamped and cut by FOB when pulseless.  Placenta spontaneously delivered, intact.   IV pitocin given for hemorrhage prophylaxis.  We sang happy birthday to baby Adriel.  Mom to postpartum.  Baby to Couplet care / Skin to Skin.  Chelsea C Ward 11/27/2018, 5:38 AM

## 2018-04-27 IMAGING — US US MFM OB COMP LESS THAN 14 WEEKS
1 series · 13 of 28 positions shown · non-contrast
Comparison: none

PATIENT INFO:

             DON LOLITO
PERFORMED BY:
                   Sonographer                              DON LOLITO
SERVICE(S) PROVIDED:
  US MFM OB COMP LESS THAN 14 WEEKS                    76801.4
 ----------------------------------------------------------------------
INDICATIONS:
  12 weeks gestation of pregnancy
FETAL EVALUATION:
 Num Of Fetuses:         1
 Gest. Sac:              Normal
 Yolk Sac:               Visualized
 Fetal Pole:             Visualized
 Fetal Heart Rate(bpm):  149
 Cardiac Activity:       Present
 Presentation:           Variable
 Placenta:               Anterior
BIOMETRY:
 CRL:      57.5  mm     G. Age:  12w 2d                  EDD:   [DATE]
GESTATIONAL AGE:
 LMP:           12w 0d        Date:  [DATE]                 EDD:   [DATE]
 Best:          12w 0d     Det. By:  LMP  ([DATE])          EDD:   [DATE]
ANATOMY:
 Cranium:               Normal appearance      Spine:                  Lumbar/sacral
                                                                       spine not well seen
 Stomach:               Seen                   Upper Extremities:      Visualized
 Bladder:               Seen                   Lower Extremities:      Visualized
CERVIX UTERUS ADNEXA:
 Left Ovary
 Seen
 Right Ovary
 Adnexa
 WNL

[Series 1: us mfm ob comp less than 14 weeks · 0.22mm/px · 13 of 46 slices shown]
[im 2/46]
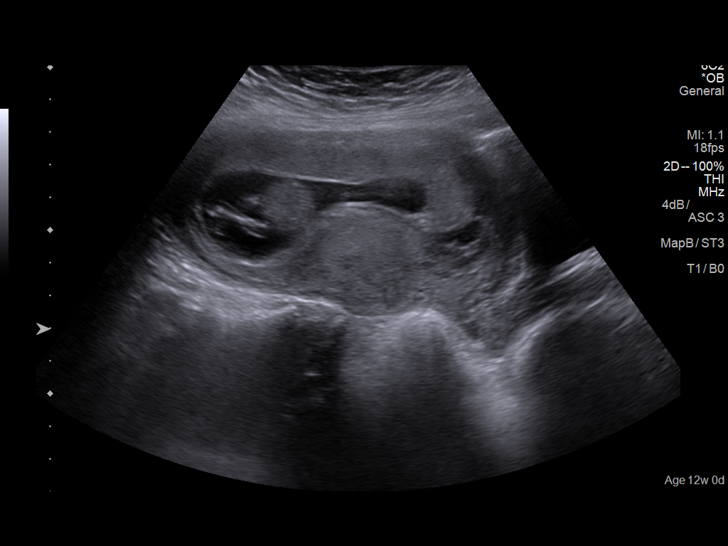
[im 6/46]
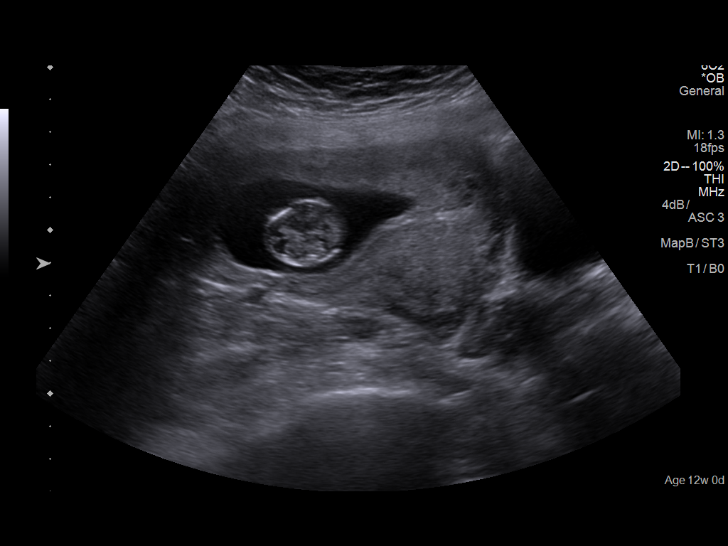
[im 9/46]
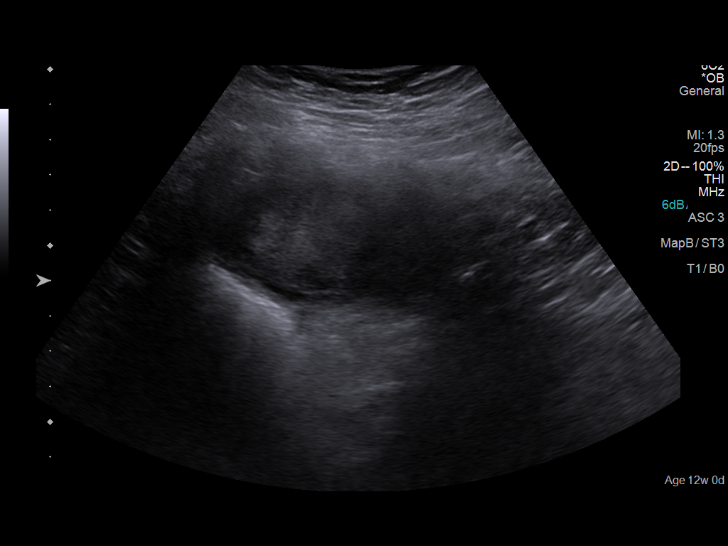
[im 12/46]
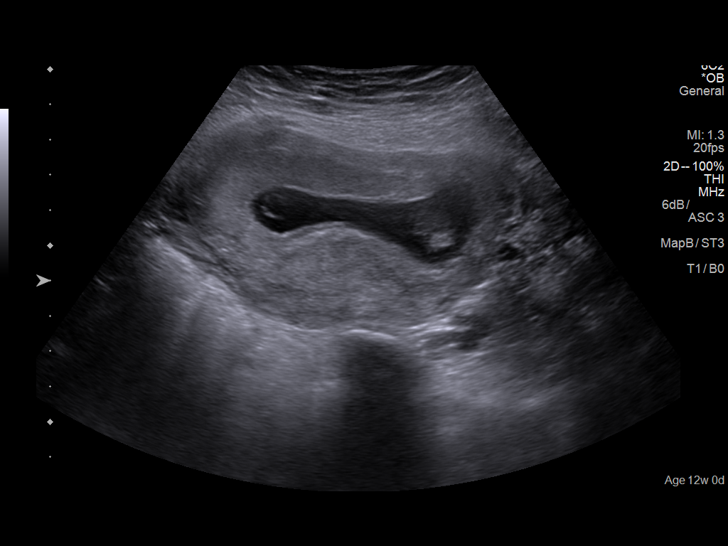
[im 16/46]
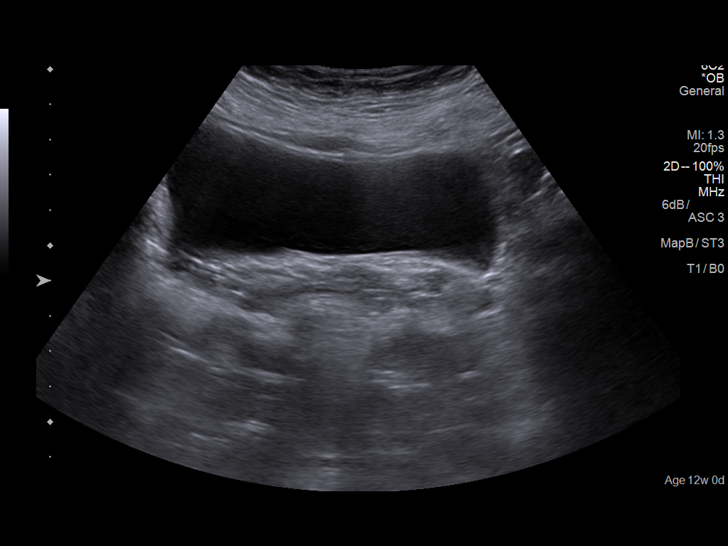
[im 19/46]
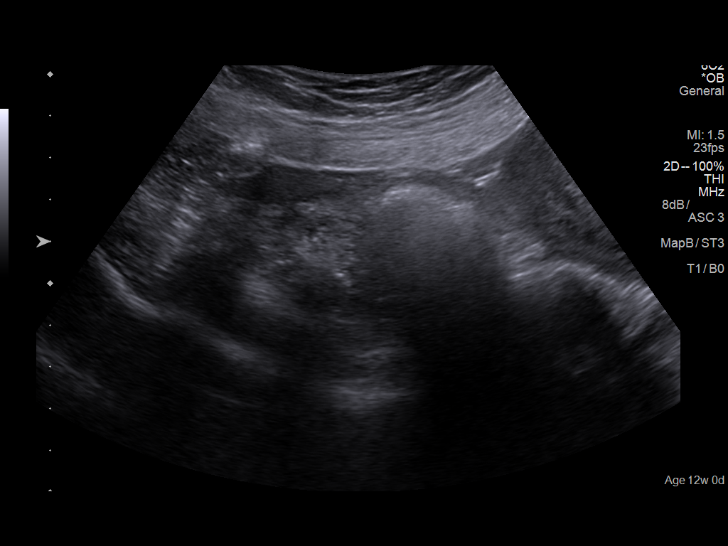
[im 24/46]
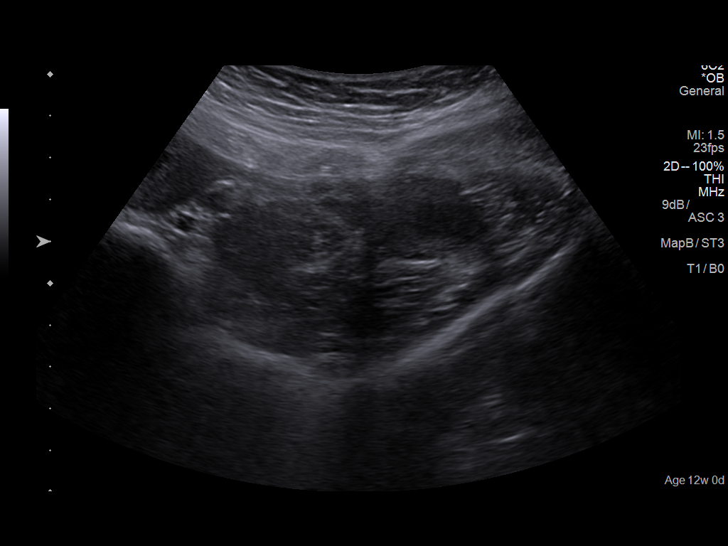
[im 27/46]
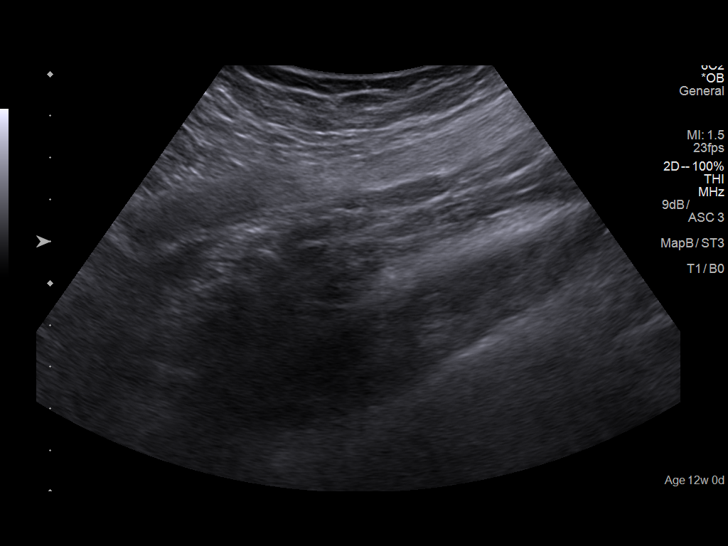
[im 31/46]
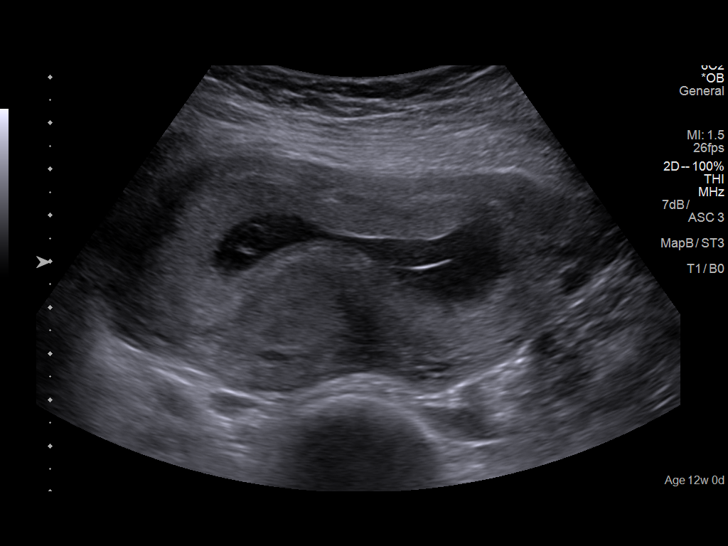
[im 34/46]
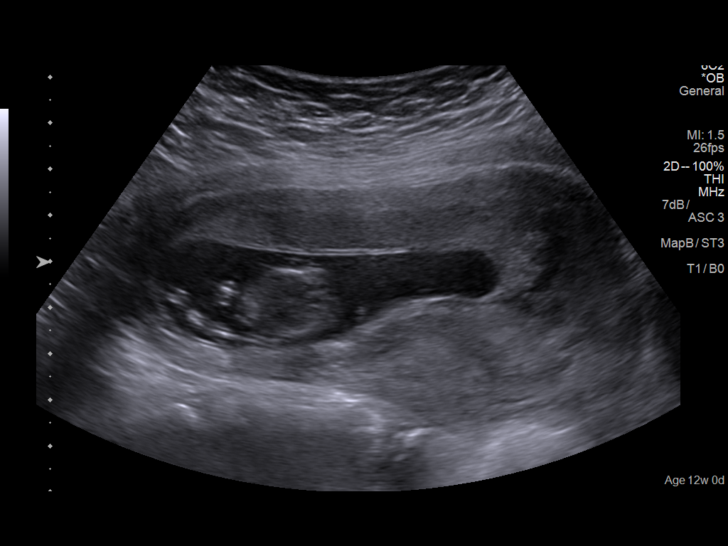
[im 37/46]
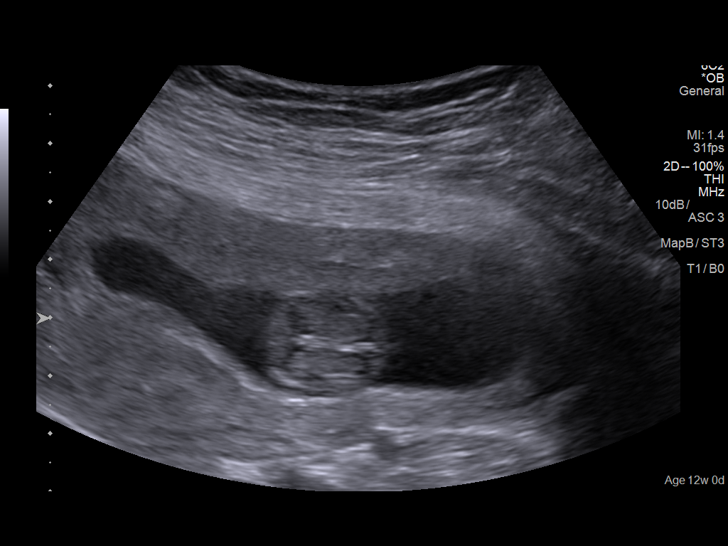
[im 41/46]
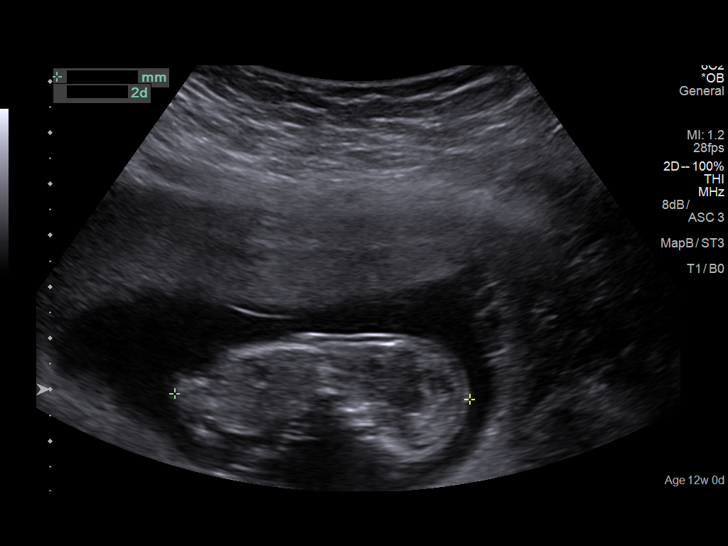
[im 44/46]
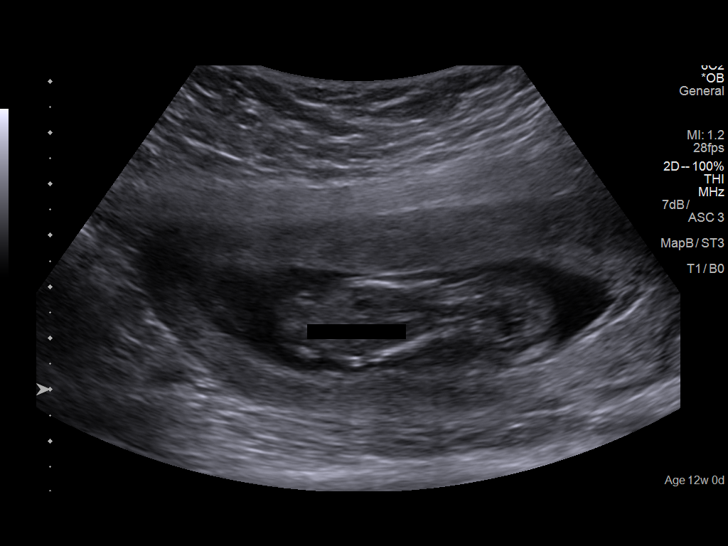

[13 of 28 positions shown; findings below may reference images not displayed]

IMPRESSION: Thank you for referring your patient for first trimester
 ultrasound.  She had the opportunity to meet with our genetic
 counselor today and elected not to have additional
 screening/testing.
 Ultrasound demonstrates a single, live, fetus at 12 weeks 0
 days.  Dating is by LMP consistent with today's ultrasound.
 First trimester anatomic survey demonstrates a symmetric
 choroid plexus, fetal bladder, stomach, and limbs.  The
 gestational age is too early for a complete anatomic survey.
 Findings were reviewed and visit completed with Spanish
 language interpreter.

                  DON LOLITO

## 2018-04-29 LAB — OB RESULTS CONSOLE GC/CHLAMYDIA
Chlamydia: NEGATIVE
Gonorrhea: NEGATIVE

## 2018-04-29 LAB — OB RESULTS CONSOLE ANTIBODY SCREEN: Antibody Screen: NEGATIVE

## 2018-04-29 LAB — OB RESULTS CONSOLE ABO/RH: RH Type: POSITIVE

## 2018-04-29 LAB — OB RESULTS CONSOLE HEPATITIS B SURFACE ANTIGEN: Hepatitis B Surface Ag: NEGATIVE

## 2018-04-29 LAB — OB RESULTS CONSOLE RPR: RPR: NONREACTIVE

## 2018-04-29 LAB — OB RESULTS CONSOLE HIV ANTIBODY (ROUTINE TESTING): HIV: NONREACTIVE

## 2018-04-29 LAB — OB RESULTS CONSOLE HGB/HCT, BLOOD: Hemoglobin: 12.6

## 2018-04-30 ENCOUNTER — Other Ambulatory Visit: Payer: Self-pay | Admitting: Nurse Practitioner

## 2018-04-30 DIAGNOSIS — Z369 Encounter for antenatal screening, unspecified: Secondary | ICD-10-CM

## 2018-05-26 ENCOUNTER — Ambulatory Visit (HOSPITAL_BASED_OUTPATIENT_CLINIC_OR_DEPARTMENT_OTHER)
Admission: RE | Admit: 2018-05-26 | Discharge: 2018-05-26 | Disposition: A | Discharge status: Home / Self Care | Payer: Medicaid Other | Source: Ambulatory Visit | Attending: Maternal & Fetal Medicine | Admitting: Maternal & Fetal Medicine

## 2018-05-26 ENCOUNTER — Ambulatory Visit
Admission: RE | Admit: 2018-05-26 | Discharge: 2018-05-26 | Disposition: A | Discharge status: Home / Self Care | Payer: Medicaid Other | Source: Ambulatory Visit | Attending: Maternal & Fetal Medicine | Admitting: Maternal & Fetal Medicine

## 2018-05-26 ENCOUNTER — Other Ambulatory Visit: Payer: Self-pay

## 2018-05-26 VITALS — BP 134/73 | HR 91 | Temp 99.2°F | Resp 18 | Wt 142.5 lb

## 2018-05-26 DIAGNOSIS — Z369 Encounter for antenatal screening, unspecified: Secondary | ICD-10-CM | POA: Diagnosis present

## 2018-05-26 DIAGNOSIS — Z810 Family history of intellectual disabilities: Secondary | ICD-10-CM | POA: Diagnosis not present

## 2018-05-26 DIAGNOSIS — Z3682 Encounter for antenatal screening for nuchal translucency: Secondary | ICD-10-CM | POA: Insufficient documentation

## 2018-05-26 DIAGNOSIS — Z3A12 12 weeks gestation of pregnancy: Secondary | ICD-10-CM | POA: Diagnosis not present

## 2018-05-26 DIAGNOSIS — Z315 Encounter for genetic counseling: Secondary | ICD-10-CM

## 2018-05-26 NOTE — Progress Notes (Signed)
Virtual Visit via Telephone Note  I connected with Jane Brewer on April 13, 20202 at 10:00 AM EDT by telephone and verified that I am speaking with the correct person using two identifiers. This conversation was held with the aid of a Spanish interpreter.   I discussed the limitations, risks, security and privacy concerns of performing an evaluation and management service by telephone and the availability of in person appointments. I also discussed with the patient that there may be a patient responsible charge related to this service. The patient expressed understanding and agreed to proceed.  [name]  [facility] for genetic counseling to review prenatal screening and testing options.  She also reported a family history of intellectual disabilities in a cousin. This note summarizes the information we discussed.    We offered the following routine screening tests for this pregnancy:  The most accurate screening option for chromosome conditions is cell free fetal DNA testing.  Though this is typically reserved for pregnancies at increased risk for aneuploidy, it is currently being made available and many insurance companies are adding coverage for this testing in low risk patients during COVID.  This test utilizes a maternal blood sample and DNA sequencing technology to isolate circulating cell free fetal DNA from maternal plasma.  The fetal DNA can then be analyzed for DNA sequences that are derived from the three most common chromosomes involved in aneuploidy, chromosomes 13, 18, and 21.  If the overall amount of DNA is greater than the expected level for any of these chromosomes, aneuploidy is suspected.  The detection rates are >99% for Down syndrome, >98% for trisomy 18 and >91% for Trisomy 13.  While we do not consider it a replacement for invasive testing and karyotype analysis, a negative result from this testing would be  reassuring, though not a guarantee of a normal chromosome complement for the baby.  An abnormal result may be suggestive of an abnormal chromosome complement, though we would still recommend CVS or amniocentesis to confirm any findings from this testing.  First trimester screening, which includes nuchal translucency ultrasound screen and first trimester maternal serum marker screening, is the test that has most recently been available for low risk patients.  The nuchal translucency has approximately an 80% detection rate for Down syndrome and can be positive for other chromosome abnormalities as well as congenital heart defects.  When combined with a maternal serum marker screening, the detection rate is up to 90% for Down syndrome and up to 97% for trisomy 18.   Given current recommendations during COVID, we are offering only the biochemical testing portion of this testing (without the ultrasound and NT portion), which has a much lower detection rate.  Maternal serum marker screening, or "quad" screen, is a blood test that measures pregnancy proteins, can provide risk assessments for Down syndrome, trisomy 18, and open neural tube defects (spina bifida, anencephaly). Because it does not directly examine the fetus, it cannot positively diagnose or rule out these problems. This is a second trimester option which could be offered along with the anatomy ultrasound. It can detect approximately 75% of babies with Down syndrome, 80% of babies with open spina bifida and 70% of babies with trisomy 9418.  Targeted ultrasound uses high frequency sound waves to create an image of the developing fetus.  An ultrasound is often recommended as a routine means of evaluating the pregnancy.  It is also used to screen for fetal  anatomy problems (for example, a heart defect) that might be suggestive of a chromosomal or other abnormality. We are currently not recommending a first trimester ultrasound other than that which would be  ordered for dating and viability.  Should these screening tests indicate an increased concern, then the following additional testing options would be offered:  The chorionic villus sampling procedure is available for first trimester chromosome analysis.  This involves the withdrawal of a small amount of chorionic villi (tissue from the developing placenta).  Risk of pregnancy loss is estimated to be approximately 1 in 200 to 1 in 100 (0.5 to 1%).  There is approximately a 1% (1 in 100) chance that the CVS chromosome results will be unclear.  Chorionic villi cannot be tested for neural tube defects.     Amniocentesis involves the removal of a small amount of amniotic fluid from the sac surrounding the fetus with the use of a thin needle inserted through the maternal abdomen and uterus.  Ultrasound guidance is used throughout the procedure.  Fetal cells from amniotic fluid are directly evaluated and > 99.5% of chromosome problems and > 98% of open neural tube defects can be detected. This procedure is generally performed after the 15th week of pregnancy.  The main risks to this procedure include complications leading to miscarriage in less than 1 in 200 cases (0.5%).  Cystic Fibrosis and Spinal Muscular Atrophy (SMA) screening were also discussed with the patient. Both conditions are recessive, which means that both parents must be carriers in order to have a child with the disease.  Cystic fibrosis (CF) is one of the most common genetic conditions in persons of Caucasian ancestry.  This condition occurs in approximately 1 in 2,500 Caucasian persons and results in thickened secretions in the lungs, digestive, and reproductive systems.  For a baby to be at risk for having CF, both of the parents must be carriers for this condition.  Approximately 1 in 38 Caucasian persons is a carrier for CF.  Current carrier testing looks for the most common mutations in the gene for CF and can detect approximately 90% of  carriers in the Caucasian population.  This means that the carrier screening can greatly reduce, but cannot eliminate, the chance for an individual to have a child with CF.  If an individual is found to be a carrier for CF, then carrier testing would be available for the partner. As part of [place]'s newborn screening profile, all babies born in the state of [place] will have a two-tier screening process.  Specimens are first tested to determine the concentration of immunoreactive trypsinogen (IRT).  The top 5% of specimens with the highest IRT values then undergo DNA testing using a panel of over 40 common CF mutations. SMA is a neurodegenerative disorder that leads to atrophy of skeletal muscle and overall weakness.  This condition is also more prevalent in the Caucasian population, with 1 in 40-1 in 60 persons being a carrier and 1 in 6,000-1 in 10,000 children being affected.  There are multiple forms of the disease, with some causing death in infancy to other forms with survival into adulthood.  The genetics of SMA is complex, but carrier screening can detect up to 95% of carriers in the Caucasian population.  Similar to CF, a negative result can greatly reduce, but cannot eliminate, the chance to have a child with SMA. The patient declined carrier screening for CF and SMA.  We talked about the option of signing  up for Early Check to have the baby tested for SMA after delivery as part of a new study in [place].  This registration can be done online prior to delivery if desired.  We obtained a detailed family history and pregnancy history.  Ms. Meda Klinefelter reported one paternal uncle who has a son with learning disabilities. There are many causes for mental retardation and learning disabilities, some genetic and some environmental.  Without a known cause for the condition, we cannot accurately assess the chance for other family members to be born with mental retardation or learning problems.  We are  happy to review medical records on this family member if they can be obtained.   She also reported that her sister had a 5 month pregnancy loss.  This sister also has three healthy children. Second trimester pregnancy loss can happen for many reasons including some genetic as well as nongenetic causes.  If any information is learned about the cause for her loss, then we are happy to review that information. The remainder of the family history was reported to be unremarkable for birth defects, intellectual delays, recurrent pregnancy loss or known chromosome abnormalities.  This is the third pregnancy for this patient and her partner.  They have a healthy 19 year old son and had one early first trimester miscarriage.  After consideration of the options, Ms. Meda Klinefelter elected to decline cell free fetal DNA testing as well as carrier screening for CF and SMA.  An ultrasound was performed at the time of this visit.  See ultrasound report for details.  The patient was encouraged to call with questions or concerns.  We can be contacted at [phone].  Labs ordered: none  Cherly Anderson, MS, CGC  I provided 25 minutes of non-face-to-face time during this encounter.   Katrina Stack

## 2018-06-12 NOTE — Progress Notes (Signed)
Hx reviewed.  I was immediately available and agree assessment and plan as outlined in [facility] Wells's note.  

## 2018-07-03 ENCOUNTER — Other Ambulatory Visit: Payer: Self-pay

## 2018-07-03 DIAGNOSIS — Z3A18 18 weeks gestation of pregnancy: Secondary | ICD-10-CM

## 2018-07-10 ENCOUNTER — Other Ambulatory Visit: Payer: Self-pay

## 2018-07-10 ENCOUNTER — Ambulatory Visit
Admission: RE | Admit: 2018-07-10 | Discharge: 2018-07-10 | Disposition: A | Discharge status: Home / Self Care | Payer: Medicaid Other | Source: Ambulatory Visit | Attending: Maternal & Fetal Medicine | Admitting: Maternal & Fetal Medicine

## 2018-07-10 DIAGNOSIS — O321XX0 Maternal care for breech presentation, not applicable or unspecified: Secondary | ICD-10-CM | POA: Diagnosis not present

## 2018-07-10 DIAGNOSIS — Z3689 Encounter for other specified antenatal screening: Secondary | ICD-10-CM | POA: Diagnosis not present

## 2018-07-10 DIAGNOSIS — Z3A18 18 weeks gestation of pregnancy: Secondary | ICD-10-CM | POA: Diagnosis not present

## 2018-07-10 IMAGING — US US MFM OB DETAIL +14 WK
1 series · 12 of 28 positions shown · non-contrast
Comparison: none

PATIENT INFO:

             [facility]ITA
PERFORMED BY:
                   Sonographer                              [facility]
SERVICE(S) PROVIDED:
 ----------------------------------------------------------------------
INDICATIONS:
  18 weeks gestation of pregnancy
FETAL EVALUATION:
 Num Of Fetuses:         1
 Fetal Heart Rate(bpm):  173
 Cardiac Activity:       Present
 Presentation:           Breech
 Placenta:               Anterior, No previa
 P. Cord Insertion:      Normal
 Amniotic Fluid
 AFI FV:      Within normal limits
BIOMETRY:
 BPD:      38.1  mm     G. Age:  17w 4d         17  %    CI:        66.33   %    70 - 86
                                                         FL/HC:      19.3   %    15.8 - 18
 HC:       150   mm     G. Age:  18w 1d         25  %
                                                         FL/BPD:     75.9   %
 FL:       28.9  mm     G. Age:  18w 6d         61  %
 HUM:      26.8  mm     G. Age:  18w 4d         56  %
GESTATIONAL AGE:
 LMP:           18w 3d        Date:  [DATE]                 EDD:   [DATE]
 U/S Today:     18w 1d                                        EDD:   [DATE]
 Best:          18w 3d     Det. By:  LMP  ([DATE])          EDD:   [DATE]
ANATOMY:
 Cranium:               Within Normal Limits   Aortic Arch:            Normal appearance
 Cavum:                 CSP visualized         Ductal Arch:            Normal appearance
 Ventricles:            Normal appearance      Diaphragm:              Within Normal Limits
 Choroid Plexus:        Within Normal Limits   Stomach:                Seen
 Cerebellum:            Within Normal Limits   Abdomen:                Within Normal
                                                                       Limits
 Posterior Fossa:       Within Normal Limits   Abdominal Wall:         Normal appearance
 Nuchal Fold:           Within Normal Limits   Cord Vessels:           3 vessels
 Face:                  Orbits visualized      Kidneys:                Normal appearance
 Lips:                  Normal appearance      Bladder:                Seen
 Thoracic:              Within Normal Limits   Spine:                  Normal appearance
 Heart:                 4-Chamber view         Upper Extremities:      Visualized
                        appears normal
 RVOT:                  Normal appearance      Lower Extremities:      Visualized
 LVOT:                  Normal appearance
CERVIX UTERUS ADNEXA:
 Cervix
 Length:            5.4  cm.
 Left Ovary
 Size(cm)       3.6  x   1.4    x  2.2       Vol(ml):
 Right Ovary
 Size(cm)       2.5  x   1.3    x  1.8       Vol(ml):

[Series 1: us mfm ob detail +14 wk · 0.20mm/px · 76 acquisitions, 12 frames shown]
[im 3/76]
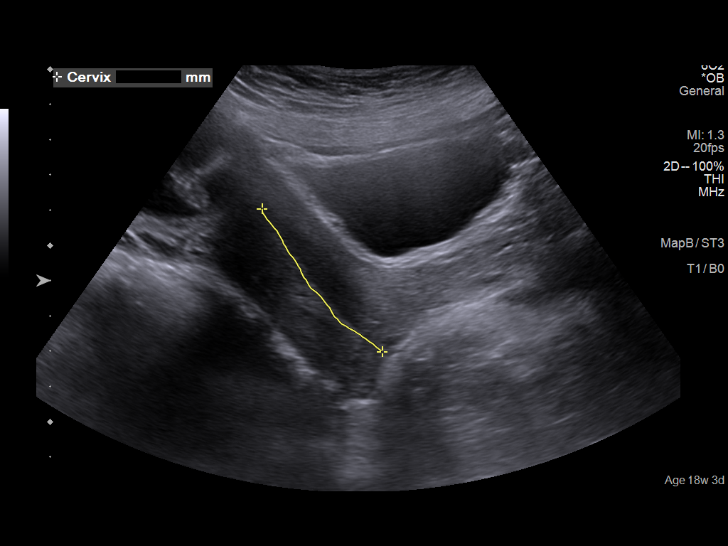
[im 9/76]
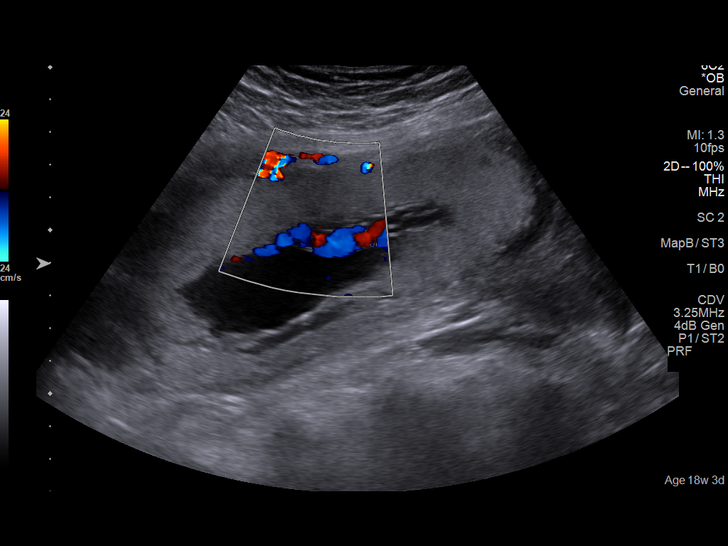
[im 14/76]
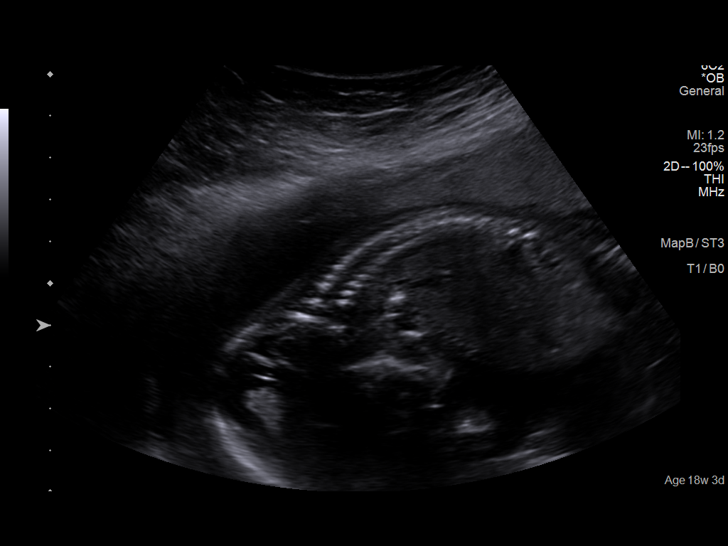
[im 23/76]
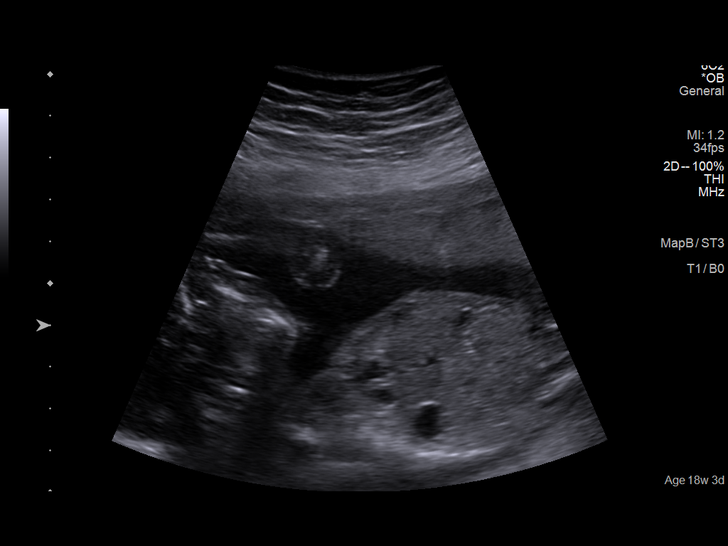
[im 28/76]
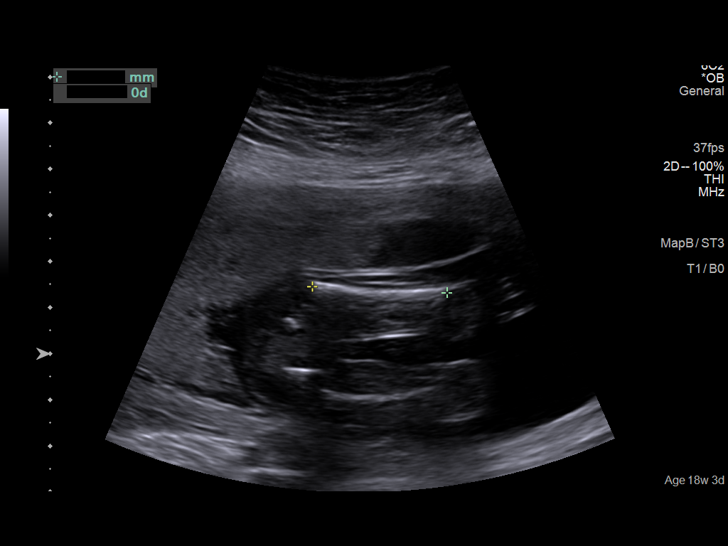
[im 34/76]
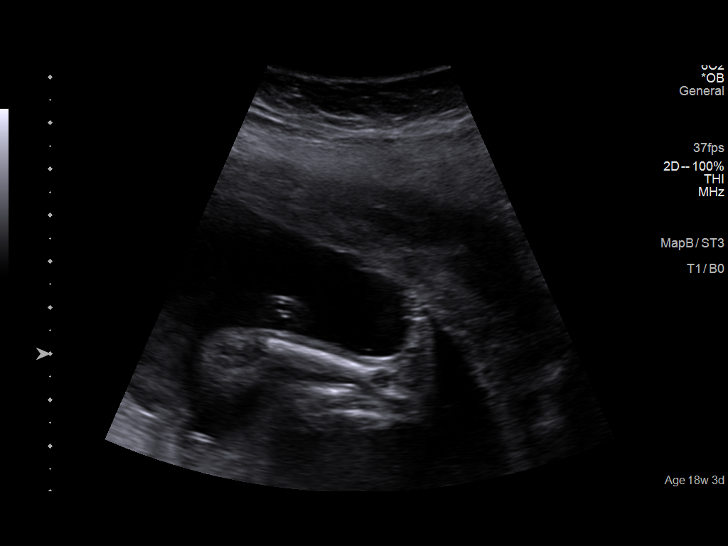
[im 42/76]
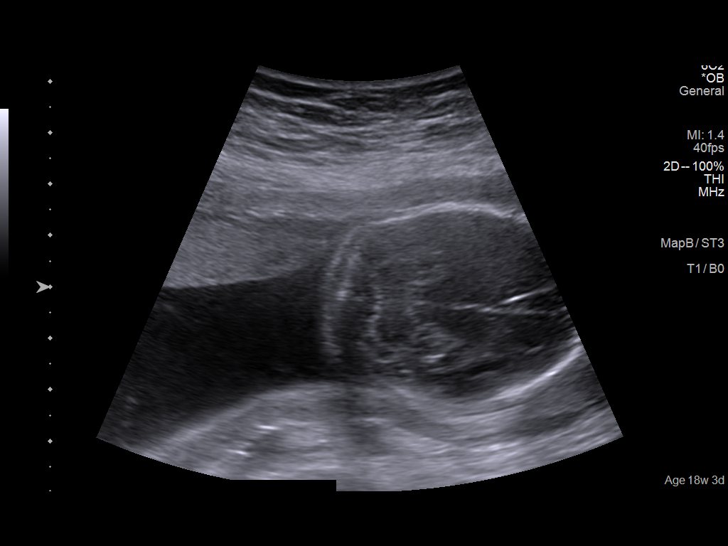
[im 48/76]
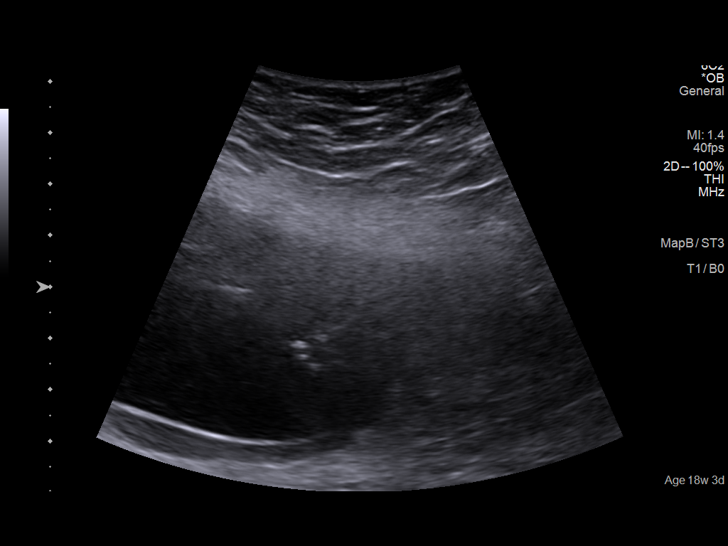
[im 53/76]
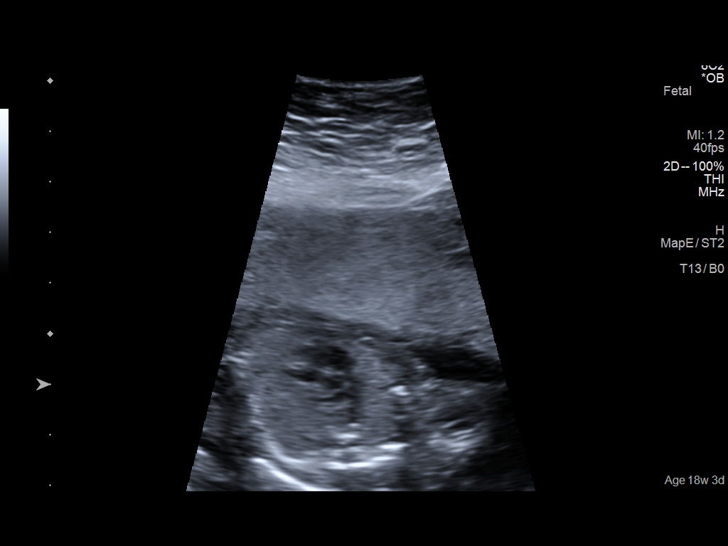
[im 62/76]
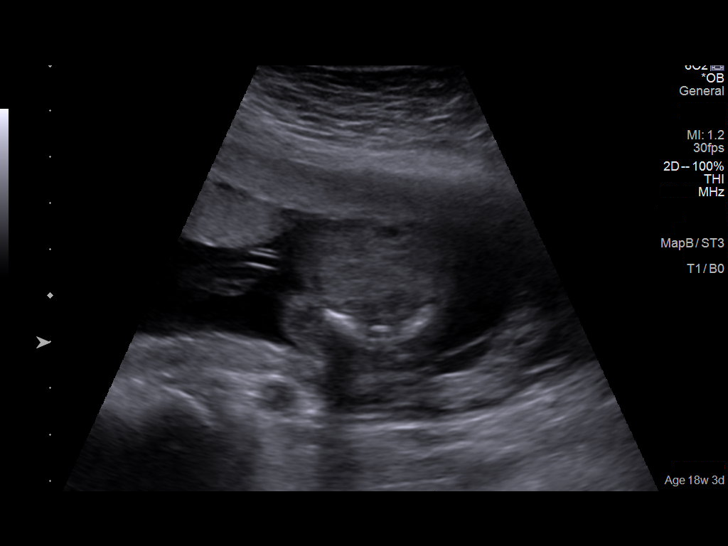
[im 67/76]
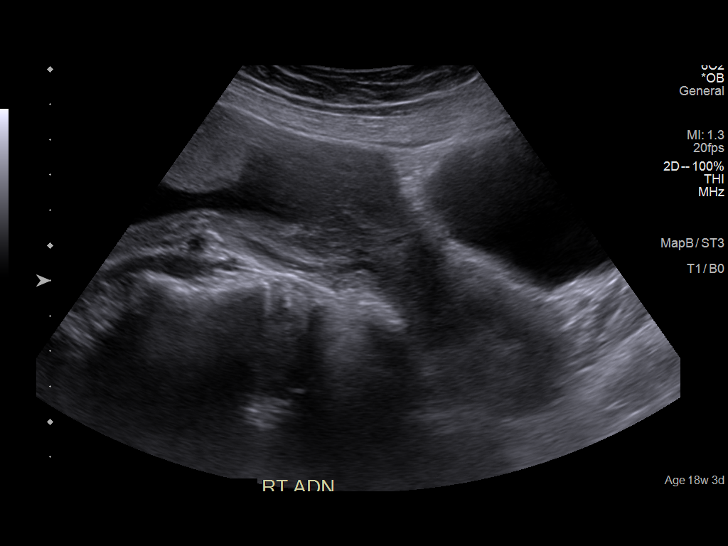
[im 73/76]
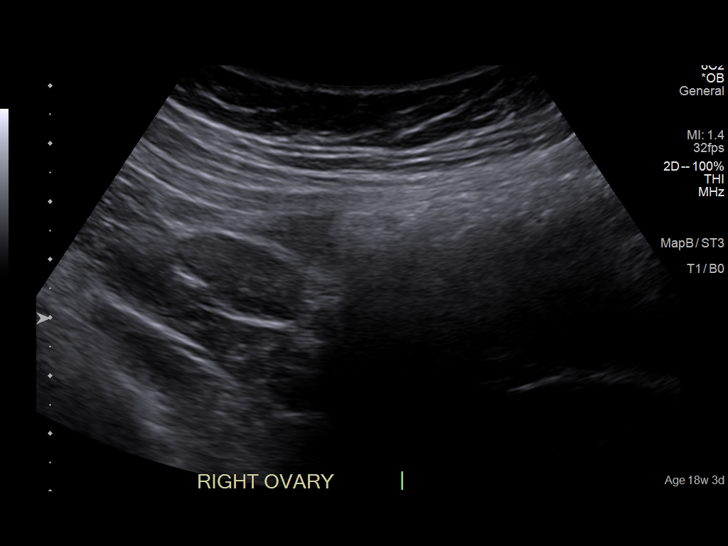

[12 of 28 positions shown; findings below may reference images not displayed]

IMPRESSION: Dear Dr.   [facility],

 Thank you for referring your patient for a fetal anatomical
 survey.

 There is a singleton gestation at 18 weeks 3 days with
 subjectively normal amniotic fluid volume.  Dating is by LMP
 consistent with LMP consistent with ultrasound performed at
 [facility] on [DATE]; measurements were
 consistent with 12 weeks 0 days.

 The fetal biometry correlates with established dating.

  The fetal anatomical survey appears within normal limits
 within the resolution of ultrasound as described above. The
 amniotic fluid volume is normal and active fetal movements
 are seen.

 It must be noted that a normal ultrasound cannot rule out
 aneuploidy.

 Thank you for allowing us to participate in your patient's care.

 assistance.

                  [facility]

## 2018-07-14 LAB — OB RESULTS CONSOLE VARICELLA ZOSTER ANTIBODY, IGG: Varicella: IMMUNE

## 2018-07-14 LAB — OB RESULTS CONSOLE RUBELLA ANTIBODY, IGM: Rubella: IMMUNE

## 2018-08-20 DIAGNOSIS — D649 Anemia, unspecified: Secondary | ICD-10-CM | POA: Insufficient documentation

## 2018-08-21 ENCOUNTER — Ambulatory Visit: Payer: Medicaid Other | Admitting: Physician Assistant

## 2018-08-21 ENCOUNTER — Other Ambulatory Visit: Payer: Self-pay

## 2018-08-21 ENCOUNTER — Encounter: Payer: Self-pay | Admitting: Physician Assistant

## 2018-08-21 DIAGNOSIS — Z3480 Encounter for supervision of other normal pregnancy, unspecified trimester: Secondary | ICD-10-CM | POA: Insufficient documentation

## 2018-08-21 MED ORDER — PRENATAL VITAMINS 28-0.8 MG PO TABS: 1.0000 | ORAL_TABLET | Freq: Every day | ORAL | 0 refills | Status: AC

## 2018-08-21 NOTE — Progress Notes (Signed)
Patient taking PNV. Denies ED/hospital visits since last RV. PTL, CCNC and Blood Transfusion Consent forms today. More PNV's given.

## 2018-08-21 NOTE — Progress Notes (Signed)
   PRENATAL VISIT NOTE  Subjective:  Jane Brewer is a 23 y.o. G3P1011 at [redacted]w[redacted]d being seen today for ongoing prenatal care.  She is currently monitored for the following issues for this low-risk pregnancy and has Family history of intellectual disabilities; Anemia; and Supervision of other normal pregnancy, antepartum on their problem list.  Patient reports backache.  Contractions: Not present. Vag. Bleeding: None.  Movement: Present. denies leaking of fluid/ROM. No headaches.  The following portions of the patient's history were reviewed and updated as appropriate: allergies, current medications, past family history, past medical history, past social history, past surgical history and problem list. Problem list updated.  Objective:   Vitals:   08/21/18 0853  BP: 111/68  Temp: 98.1 F (36.7 C)  Weight: 154 lb 3.2 oz (69.9 kg)    Fetal Status: Fetal Heart Rate (bpm): 156 Fundal Height: 26 cm Movement: Present     General:  Alert, oriented and cooperative. Patient is in no acute distress.  Skin: Skin is warm and dry. No rash noted.   Cardiovascular: Normal heart rate noted  Respiratory: Normal respiratory effort, no problems with respiration noted  Abdomen: Soft, gravid, appropriate for gestational age.  Pain/Pressure: Absent     Pelvic: Cervical exam deferred        Extremities: Normal range of motion.  Edema: None  Mental Status: Normal mood and affect. Normal behavior. Normal judgment and thought content.   Assessment and Plan:  Pregnancy: G3P1011 at [redacted]w[redacted]d  1. Supervision of other normal pregnancy, antepartum [facility] blood transfusion consent completed today. Complains of low back pain after sitting and with walking, not at present. No associated urinary sx or numbness/tingling. Suspect MS etiology related to changing body mechanics during pregnancy. Enc stretching and walking. Offered physical therapy referral - pt considering.   Preterm labor symptoms and general  obstetric precautions including but not limited to vaginal bleeding, contractions, leaking of fluid and fetal movement were reviewed in detail with the patient. Please refer to After Visit Summary for other counseling recommendations.  Return in about 3 weeks (around 09/11/2018) for Routine prenatal care.  Future Appointments  Date Time Provider [place]  09/12/2018  9:00 AM [facility] PROVIDER AC-[facility] None    Lora Havens, PA-C

## 2018-09-12 ENCOUNTER — Encounter: Payer: Self-pay | Admitting: Nurse Practitioner

## 2018-09-12 ENCOUNTER — Other Ambulatory Visit: Payer: Self-pay

## 2018-09-12 ENCOUNTER — Ambulatory Visit: Payer: Medicaid Other | Admitting: Nurse Practitioner

## 2018-09-12 DIAGNOSIS — Z3480 Encounter for supervision of other normal pregnancy, unspecified trimester: Secondary | ICD-10-CM | POA: Diagnosis not present

## 2018-09-12 DIAGNOSIS — O99012 Anemia complicating pregnancy, second trimester: Secondary | ICD-10-CM

## 2018-09-12 DIAGNOSIS — Z23 Encounter for immunization: Secondary | ICD-10-CM

## 2018-09-12 LAB — HEMOGLOBIN, FINGERSTICK
Hemoglobin: 10.5 g/dL — ABNORMAL LOW (ref 11.1–15.9)
Hemoglobin: 10.5 g/dL — ABNORMAL LOW (ref 11.1–15.9)

## 2018-09-12 LAB — OB RESULTS CONSOLE HIV ANTIBODY (ROUTINE TESTING): HIV: NONREACTIVE

## 2018-09-12 MED ORDER — FERROUS SULFATE 325 (65 FE) MG PO TABS: 325.0000 mg | ORAL_TABLET | Freq: Every day | ORAL | 3 refills | Status: AC

## 2018-09-12 NOTE — Progress Notes (Signed)
  PRENATAL VISIT NOTE  Subjective:  Jane Brewer is a 23 y.o. G3P1011 at [redacted]w[redacted]d being seen today for ongoing prenatal care.  She is currently monitored for the following issues for this low-risk pregnancy and has Family history of intellectual disabilities; Anemia; Supervision of other normal pregnancy, antepartum; and Anemia of mother in pregnancy, antepartum, second trimester on their problem list.  Patient reports no complaints.  Contractions: Not present. Vag. Bleeding: None.  Movement: Present. Denies leaking of fluid/ROM.   The following portions of the patient's history were reviewed and updated as appropriate: allergies, current medications, past family history, past medical history, past social history, past surgical history and problem list. Problem list updated.  Objective:   Vitals:   09/12/18 0852  BP: 98/67  Temp: 98.5 F (36.9 C)  Weight: 157 lb 3.2 oz (71.3 kg)    Fetal Status: Fetal Heart Rate (bpm): 150 Fundal Height: 28 cm Movement: Present     General:  Alert, oriented and cooperative. Patient is in no acute distress.  Skin: Skin is warm and dry. No rash noted.   Cardiovascular: Normal heart rate noted  Respiratory: Normal respiratory effort, no problems with respiration noted  Abdomen: Soft, gravid, appropriate for gestational age.  Pain/Pressure: Absent     Pelvic: Cervical exam deferred        Extremities: Normal range of motion.  Edema: None  Mental Status: Normal mood and affect. Normal behavior. Normal judgment and thought content.   Assessment and Plan:  Pregnancy: G3P1011 at [redacted]w[redacted]d  1. Supervision of other normal pregnancy, antepartum Here for 28 wk labs Taking PNV daily, denies smoking now or prior to pregnancy  - Glucose, 1 hour gestational - RPR - [facility] - Hemoglobin, fingerstick  *Please give client a note to excuse husband from work for bringing client to appointment - he will be late and needs note to excuse  him.  Client verbalizes understanding and is in agreement with plan of care   Preterm labor symptoms and general obstetric precautions including but not limited to vaginal bleeding, contractions, leaking of fluid and fetal movement were reviewed in detail with the patient. Please refer to After Visit Summary for other counseling recommendations.  Return in about 2 weeks (around 09/26/2018) for routine prenatal care, telehealth.  Future Appointments  Date Time Provider [place]  09/26/2018  2:00 PM [facility] PROVIDER AC-[facility] None    Berniece Andreas, NP

## 2018-09-12 NOTE — Progress Notes (Signed)
In for visit; taking PNV; 28 wk labs today; agrees to Tdap today Debera Lat, RN

## 2018-09-13 LAB — FE+CBC/D/PLT+TIBC+FER+RETIC
Basophils Absolute: 0 10*3/uL (ref 0.0–0.2)
Basos: 0 %
EOS (ABSOLUTE): 0 10*3/uL (ref 0.0–0.4)
Eos: 0 %
Ferritin: 10 ng/mL — ABNORMAL LOW (ref 15–150)
Hematocrit: 31.2 % — ABNORMAL LOW (ref 34.0–46.6)
Hemoglobin: 10.5 g/dL — ABNORMAL LOW (ref 11.1–15.9)
Immature Grans (Abs): 0.1 10*3/uL (ref 0.0–0.1)
Immature Granulocytes: 1 %
Iron Saturation: 16 % (ref 15–55)
Iron: 68 ug/dL (ref 27–159)
Lymphocytes Absolute: 2.2 10*3/uL (ref 0.7–3.1)
Lymphs: 22 %
MCH: 32.5 pg (ref 26.6–33.0)
MCHC: 33.7 g/dL (ref 31.5–35.7)
MCV: 97 fL (ref 79–97)
Monocytes Absolute: 0.4 10*3/uL (ref 0.1–0.9)
Monocytes: 5 %
Neutrophils Absolute: 6.9 10*3/uL (ref 1.4–7.0)
Neutrophils: 72 %
Platelets: 242 10*3/uL (ref 150–450)
RBC: 3.23 x10E6/uL — ABNORMAL LOW (ref 3.77–5.28)
RDW: 12.8 % (ref 11.7–15.4)
Retic Ct Pct: 1.9 % (ref 0.6–2.6)
Total Iron Binding Capacity: 436 ug/dL (ref 250–450)
UIBC: 368 ug/dL (ref 131–425)
WBC: 9.6 10*3/uL (ref 3.4–10.8)

## 2018-09-13 LAB — GLUCOSE, 1 HOUR GESTATIONAL: Gestational Diabetes Screen: 102 mg/dL (ref 65–139)

## 2018-09-13 LAB — RPR: RPR Ser Ql: NONREACTIVE

## 2018-09-26 ENCOUNTER — Other Ambulatory Visit: Payer: Self-pay

## 2018-09-26 ENCOUNTER — Ambulatory Visit: Payer: Medicaid Other | Admitting: Nurse Practitioner

## 2018-09-26 VITALS — Wt 160.0 lb

## 2018-09-26 DIAGNOSIS — Z3480 Encounter for supervision of other normal pregnancy, unspecified trimester: Secondary | ICD-10-CM

## 2018-09-26 DIAGNOSIS — O99012 Anemia complicating pregnancy, second trimester: Secondary | ICD-10-CM

## 2018-09-26 NOTE — Progress Notes (Signed)
  TELEPHONE OBSTETRICS VISIT ENCOUNTER NOTE  I connected with Dietrich Pates on 09/26/18 at  2:00 PM EDT by telephone at home and verified that I am speaking with the correct person using two identifiers.   I discussed the limitations, risks, security and privacy concerns of performing an evaluation and management service by telephone and the availability of in person appointments. I also discussed with the patient that there may be a patient responsible charge related to this service. The patient expressed understanding and agreed to proceed.  Subjective:  Jane Brewer is a 23 y.o. G3P1011 at [redacted]w[redacted]d being followed for ongoing prenatal care.  She is currently monitored for the following issues for this low-risk pregnancy and has Family history of intellectual disabilities; Anemia; Supervision of other normal pregnancy, antepartum; and Anemia of mother in pregnancy, antepartum, second trimester on their problem list.  Patient reports Swelling in feet . Reports fetal movement. Denies any contractions, bleeding or leaking of fluid.   The following portions of the patient's history were reviewed and updated as appropriate: allergies, current medications, past family history, past medical history, past social history, past surgical history and problem list.   Objective:   General:  Alert, oriented and cooperative.   Mental Status: Normal mood and affect perceived. Normal judgment and thought content.  Rest of physical exam deferred due to type of encounter  Assessment and Plan:  Pregnancy: G3P1011 at [redacted]w[redacted]d 1. Supervision of other normal pregnancy, antepartum Client doing well Denies any headaches or vaginal discharge  Admits to swelling in feet - advised 8 glasses of water daily and decreasing sodium from daily diet.   2. Anemia of mother in pregnancy, antepartum, second trimester Client admits to taking PNV separate of iron tabs  Preterm labor symptoms and general obstetric  precautions including but not limited to vaginal bleeding, contractions, leaking of fluid and fetal movement were reviewed in detail with the patient.  I discussed the assessment and treatment plan with the patient. The patient was provided an opportunity to ask questions and all were answered. The patient agreed with the plan and demonstrated an understanding of the instructions. The patient was advised to call back or seek an in-person office evaluation/go to the hospital for any urgent or concerning symptoms.  Please refer to After Visit Summary for other counseling recommendations.   I provided 6 minutes of non-face-to-face time during this encounter.  Return in about 2 weeks (around 10/10/2018) for routine prenatal care.  Future Appointments  Date Time Provider [place]  10/10/2018  8:40 AM [facility] PROVIDER AC[facility] None    Berniece Andreas, NP      .

## 2018-09-26 NOTE — Progress Notes (Signed)
Call to client for Telehealth visit; confirmed identity, discussed limitations, informed is a billable visit-agrees to visit; taking Fe & PNV;weight entered per client today; desires [place] post partum Debera Lat, RN

## 2018-10-10 ENCOUNTER — Other Ambulatory Visit: Payer: Self-pay

## 2018-10-10 ENCOUNTER — Ambulatory Visit: Payer: Medicaid Other | Admitting: Advanced Practice Midwife

## 2018-10-10 ENCOUNTER — Encounter: Payer: Self-pay | Admitting: Advanced Practice Midwife

## 2018-10-10 VITALS — BP 108/67 | Temp 98.7°F | Wt 165.2 lb

## 2018-10-10 DIAGNOSIS — O99012 Anemia complicating pregnancy, second trimester: Secondary | ICD-10-CM

## 2018-10-10 DIAGNOSIS — Z3480 Encounter for supervision of other normal pregnancy, unspecified trimester: Secondary | ICD-10-CM

## 2018-10-10 LAB — HEMOGLOBIN, FINGERSTICK: Hemoglobin: 10.8 g/dL — ABNORMAL LOW (ref 11.1–15.9)

## 2018-10-10 NOTE — Progress Notes (Signed)
   PRENATAL VISIT NOTE  Subjective:  Jane Brewer is a 23 y.o. G3P1011 at [redacted]w[redacted]d being seen today for ongoing prenatal care.  She is currently monitored for the following issues for this low-risk pregnancy and has Family history of intellectual disabilities; Anemia; Supervision of other normal pregnancy, antepartum; and Anemia of mother in pregnancy, antepartum, second trimester on their problem list.  Patient reports no complaints.  Contractions: Not present. Vag. Bleeding: None.  Movement: Present. Denies leaking of fluid/ROM.   The following portions of the patient's history were reviewed and updated as appropriate: allergies, current medications, past family history, past medical history, past social history, past surgical history and problem list. Problem list updated.  Objective:   Vitals:   10/10/18 0839  BP: 108/67  Temp: 98.7 F (37.1 C)  Weight: 165 lb 3.2 oz (74.9 kg)    Fetal Status: Fetal Heart Rate (bpm): 160 Fundal Height: 33 cm Movement: Present     General:  Alert, oriented and cooperative. Patient is in no acute distress.  Skin: Skin is warm and dry. No rash noted.   Cardiovascular: Normal heart rate noted  Respiratory: Normal respiratory effort, no problems with respiration noted  Abdomen: Soft, gravid, appropriate for gestational age.  Pain/Pressure: Absent     Pelvic: Cervical exam deferred        Extremities: Normal range of motion.  Edema: None  Mental Status: Normal mood and affect. Normal behavior. Normal judgment and thought content.   Assessment and Plan:  Pregnancy: G3P1011 at [redacted]w[redacted]d  1. Anemia of mother in pregnancy, antepartum, second trimester Hgb today.  Taking FeSo4 I daily with oj seperately from PNV  2. Supervision of other normal pregnancy, antepartum Feels well.  Thinking about IUD pp for birth control   Preterm labor symptoms and general obstetric precautions including but not limited to vaginal bleeding, contractions,  leaking of fluid and fetal movement were reviewed in detail with the patient. Please refer to After Visit Summary for other counseling recommendations.  Return in about 2 weeks (around 10/24/2018) for routine [facility], telehealth.  No future appointments.  Herbie Saxon, CNM

## 2018-10-10 NOTE — Progress Notes (Signed)
In for visit; taking PNV & Fe; denies hospital visits since last appt Yassen Kinnett, RN  

## 2018-10-27 ENCOUNTER — Ambulatory Visit: Payer: Medicaid Other | Admitting: Advanced Practice Midwife

## 2018-10-27 ENCOUNTER — Other Ambulatory Visit: Payer: Self-pay

## 2018-10-27 DIAGNOSIS — O99012 Anemia complicating pregnancy, second trimester: Secondary | ICD-10-CM

## 2018-10-27 DIAGNOSIS — Z3480 Encounter for supervision of other normal pregnancy, unspecified trimester: Secondary | ICD-10-CM

## 2018-10-27 NOTE — Progress Notes (Signed)
TC to patient for telehealth maternity visit. Patient taking PNV and iron once each day. Patient plans to breastfeed her baby and has chosen [facility] for pediatrician.Jenetta Downer, RN

## 2018-10-27 NOTE — Progress Notes (Signed)
   TELEPHONE OBSTETRICS VISIT ENCOUNTER NOTE  I connected with Jane Brewer @ on 10/27/18 at  4:00 PM EDT by telephone at home and verified that I am speaking with the correct person using two identifiers.   I discussed the limitations, risks, security and privacy concerns of performing an evaluation and management service by telephone and the availability of in person appointments. I also discussed with the patient that there may be a patient responsible charge related to this service. The patient expressed understanding and agreed to proceed.  Subjective:  Jane Brewer is a 23 y.o. G3P1011 at [redacted]w[redacted]d being followed for ongoing prenatal care.  She is currently monitored for the following issues for this low-risk pregnancy and has Family history of intellectual disabilities; Anemia; Supervision of other normal pregnancy, antepartum; and Anemia of mother in pregnancy, antepartum, second trimester on their problem list.  Patient reports no complaints. Reports fetal movement. Denies any contractions, bleeding or leaking of fluid.   The following portions of the patient's history were reviewed and updated as appropriate: allergies, current medications, past family history, past medical history, past social history, past surgical history and problem list.   Objective:   General:  Alert, oriented and cooperative.   Mental Status: Normal mood and affect perceived. Normal judgment and thought content.  Rest of physical exam deferred due to type of encounter  Assessment and Plan:  Pregnancy: G3P1011 at [redacted]w[redacted]d 1. Anemia of mother in pregnancy, antepartum, second trimester Taking I FeSo4 daily with oj seperately from vits  2. Supervision of other normal pregnancy, antepartum Feeling well. Plans to breast feed.  Preterm labor symptoms and general obstetric precautions including but not limited to vaginal bleeding, contractions, leaking of fluid and fetal movement were  reviewed in detail with the patient.  I discussed the assessment and treatment plan with the patient. The patient was provided an opportunity to ask questions and all were answered. The patient agreed with the plan and demonstrated an understanding of the instructions. The patient was advised to call back or seek an in-person office evaluation/go to the hospital for any urgent or concerning symptoms.  Please refer to After Visit Summary for other counseling recommendations.   I provided 9 minutes of non-face-to-face time during this encounter.  Return in about 2 weeks (around 11/10/2018) for 36 wk labs.  Future Appointments  Date Time Provider [place]  11/10/2018 10:40 AM [facility] PROVIDER AC-MAT None    Herbie Saxon, CNM

## 2018-10-30 NOTE — Addendum Note (Signed)
Addended by: Cletis Media on: 10/30/2018 10:47 AM   Modules accepted: Orders

## 2018-11-10 ENCOUNTER — Other Ambulatory Visit: Payer: Self-pay

## 2018-11-10 ENCOUNTER — Ambulatory Visit: Payer: Medicaid Other | Admitting: Advanced Practice Midwife

## 2018-11-10 VITALS — BP 100/64 | Temp 98.6°F | Wt 171.6 lb

## 2018-11-10 DIAGNOSIS — O99012 Anemia complicating pregnancy, second trimester: Secondary | ICD-10-CM | POA: Diagnosis not present

## 2018-11-10 DIAGNOSIS — Z23 Encounter for immunization: Secondary | ICD-10-CM

## 2018-11-10 DIAGNOSIS — Z3480 Encounter for supervision of other normal pregnancy, unspecified trimester: Secondary | ICD-10-CM

## 2018-11-10 LAB — HEMOGLOBIN, FINGERSTICK: Hemoglobin: 11.7 g/dL (ref 11.1–15.9)

## 2018-11-10 LAB — OB RESULTS CONSOLE GBS: GBS: NEGATIVE

## 2018-11-10 LAB — OB RESULTS CONSOLE GC/CHLAMYDIA
Chlamydia: NEGATIVE
Gonorrhea: NEGATIVE

## 2018-11-10 NOTE — Progress Notes (Signed)
Patient here for MHRV at 36 weeks. Needs Hgb and 36 week labs. 36 week packet given. Needs PNV, pharmacy on record verified.Jenetta Downer, RN

## 2018-11-10 NOTE — Progress Notes (Signed)
Flu vaccine given, left deltoid, tolerated well, VIS given.Jenetta Downer, RN

## 2018-11-10 NOTE — Addendum Note (Signed)
Addended by: Debera Lat on: 11/10/2018 01:30 PM   Modules accepted: Orders

## 2018-11-10 NOTE — Progress Notes (Signed)
   PRENATAL VISIT NOTE  Subjective:  Jane Brewer is a 23 y.o. G3P1011 at [redacted]w[redacted]d being seen today for ongoing prenatal care.  She is currently monitored for the following issues for this low-risk pregnancy and has Family history of intellectual disabilities; Anemia; Supervision of other normal pregnancy, antepartum; and Anemia of mother in pregnancy, antepartum, second trimester on their problem list.  Patient reports no complaints.  Contractions: Not present. Vag. Bleeding: None.  Movement: Present.  Denies leaking of fluid/ROM.   The following portions of the patient's history were reviewed and updated as appropriate: allergies, current medications, past family history, past medical history, past social history, past surgical history and problem list. Problem list updated.  Objective:   Vitals:   11/10/18 1039  BP: 100/64  Temp: 98.6 F (37 C)  Weight: 171 lb 9.6 oz (77.8 kg)    Fetal Status: Fetal Heart Rate (bpm): 160 Fundal Height: 38 cm Movement: Present  Presentation: Vertex  General:  Alert, oriented and cooperative. Patient is in no acute distress.  Skin: Skin is warm and dry. No rash noted.   Cardiovascular: Normal heart rate noted  Respiratory: Normal respiratory effort, no problems with respiration noted  Abdomen: Soft, gravid, appropriate for gestational age.  Pain/Pressure: Absent     Pelvic: Cervical exam deferred        Extremities: Normal range of motion.  Edema: None  Mental Status: Normal mood and affect. Normal behavior. Normal judgment and thought content.   Assessment and Plan:  Pregnancy: G3P1011 at [redacted]w[redacted]d  1. Anemia of mother in pregnancy, antepartum, second trimester Taking FeSo4 I daily  2. Supervision of other normal pregnancy, antepartum Feels well.  Not working.  Has car seat.  Knows when to go to L&D. Walks 5x/wk x 20 min.  PNV rx given with rf x1 year - Flu Vaccine QUAD 36+ mos IM - Hemoglobin, venipuncture   Preterm labor  symptoms and general obstetric precautions including but not limited to vaginal bleeding, contractions, leaking of fluid and fetal movement were reviewed in detail with the patient. Please refer to After Visit Summary for other counseling recommendations.  Return in about 2 weeks (around 11/24/2018) for telehealth.  Future Appointments  Date Time Provider [place]  11/17/2018  1:40 PM [facility] PROVIDER AC-MAT None    Herbie Saxon, CNM

## 2018-11-12 LAB — CHLAMYDIA/GC NAA, CONFIRMATION
Chlamydia trachomatis, NAA: NEGATIVE
Neisseria gonorrhoeae, NAA: NEGATIVE

## 2018-11-14 LAB — CULTURE, BETA STREP (GROUP B ONLY): Strep Gp B Culture: NEGATIVE

## 2018-11-17 ENCOUNTER — Ambulatory Visit: Payer: Medicaid Other | Admitting: Family Medicine

## 2018-11-17 ENCOUNTER — Encounter: Payer: Self-pay | Admitting: Family Medicine

## 2018-11-17 ENCOUNTER — Other Ambulatory Visit: Payer: Self-pay

## 2018-11-17 DIAGNOSIS — Z3480 Encounter for supervision of other normal pregnancy, unspecified trimester: Secondary | ICD-10-CM

## 2018-11-17 DIAGNOSIS — O99012 Anemia complicating pregnancy, second trimester: Secondary | ICD-10-CM

## 2018-11-17 NOTE — Progress Notes (Signed)
Verified I was speaking with client using two identifiers and client stated okay to proceed with telehealth appt. Negative responses to Covid-19 screening questions and denies international travel for self or FOB since pregnant.Correctly verbalizes how to take PNV and iron tablet. Client does not have thermometer or BP cuff at home. Documented weight taken by client at home today. Client due [place] [facility] appt 11/24/2018, but need am appt. Scheduled appt for 11/26/2018 am. Rich Number, RN

## 2018-11-17 NOTE — Progress Notes (Signed)
   PRENATAL VISIT NOTE  Subjective:  Jane Brewer is a 23 y.o. G3P1011 at [redacted]w[redacted]d being seen today for ongoing prenatal care.  She is currently monitored for the following issues for this low-risk pregnancy and has Family history of intellectual disabilities; Anemia; Supervision of other normal pregnancy, antepartum; and Anemia of mother in pregnancy, antepartum, second trimester on their problem list.  Patient reports occasional ctx, 8-9 per day but has had 5 in a hour. Reports increased vaginal fluid in the past week but also reports "water" in the last day. Reports no odor or itching with this. Describes that she here underwear are wet. .  Contractions: Irregular. Vag. Bleeding: None.  Movement: Present.  Reports leaking of fluid- unsure if normal discharge vs ROM.    The following portions of the patient's history were reviewed and updated as appropriate: allergies, current medications, past family history, past medical history, past social history, past surgical history and problem list. Problem list updated.  Objective:   Vitals:   11/17/18 1348  Weight: 172 lb (78 kg)    Fetal Status:     Movement: Present     General:  Alert, oriented and cooperative. Patient is in no acute distress.  Skin: Skin is warm and dry. No rash noted.   Cardiovascular: Normal heart rate noted  Respiratory: Normal respiratory effort, no problems with respiration noted  Abdomen: Soft, gravid, appropriate for gestational age.  Pain/Pressure: Absent     Pelvic: Cervical exam deferred        Extremities: Normal range of motion.  Edema: None  Mental Status: Normal mood and affect. Normal behavior. Normal judgment and thought content.   Assessment and Plan:  Pregnancy: G3P1011 at [redacted]w[redacted]d  1. Anemia of mother in pregnancy, antepartum, second trimester Continue Fe Improved since starting Fe   2. Supervision of other normal pregnancy, antepartum Up to date Complains of leaking fluid-- I  recommended she come to the [facility] today to be evaluated. Patient does not have a ride and could not be here before 5 PM. I recommended she go to the hospital when she has a ride and scheduled here for 0900 on 10/6 in case she does not go to the hospital for r/o ROM. Reviewed in detail importance of evaluation and discussed she should go to the hospital for ctx, pain, bleeding, decreased FM or any other concern. She voiced understanding.    Term labor symptoms and general obstetric precautions including but not limited to vaginal bleeding, contractions, leaking of fluid and fetal movement were reviewed in detail with the patient. Please refer to After Visit Summary for other counseling recommendations.  Return in about 1 day (around 11/18/2018) for r/o ROM visit.  Future Appointments  Date Time Provider [place]  11/18/2018  9:00 AM [facility] PROVIDER AC-[facility] None  11/26/2018 10:40 AM [facility] PROVIDER AC-MAT None    Caren Macadam, MD

## 2018-11-18 ENCOUNTER — Ambulatory Visit: Payer: Medicaid Other | Admitting: Family Medicine

## 2018-11-18 VITALS — BP 109/69 | Temp 97.9°F | Wt 174.2 lb

## 2018-11-18 DIAGNOSIS — O26893 Other specified pregnancy related conditions, third trimester: Secondary | ICD-10-CM

## 2018-11-18 DIAGNOSIS — N898 Other specified noninflammatory disorders of vagina: Secondary | ICD-10-CM

## 2018-11-18 DIAGNOSIS — Z3480 Encounter for supervision of other normal pregnancy, unspecified trimester: Secondary | ICD-10-CM

## 2018-11-18 DIAGNOSIS — O99012 Anemia complicating pregnancy, second trimester: Secondary | ICD-10-CM

## 2018-11-18 LAB — WET PREP FOR TRICH, YEAST, CLUE
Trichomonas Exam: NEGATIVE
Yeast Exam: NEGATIVE

## 2018-11-18 NOTE — Progress Notes (Signed)
In to follow-up 11/17/18 call; ROI signed to complete FMLA form for partner Debera Lat, RN

## 2018-11-18 NOTE — Progress Notes (Signed)
   PRENATAL VISIT NOTE  Subjective:  Jane Brewer is a 23 y.o. G3P1011 at [redacted]w[redacted]d being seen today for ongoing prenatal care.  She is currently monitored for the following issues for this low-risk pregnancy and has Family history of intellectual disabilities; Anemia; Supervision of other normal pregnancy, antepartum; and Anemia of mother in pregnancy, antepartum, second trimester on their problem list.  Patient reports increased fluid/leaking.  Contractions: Irregular. Vag. Bleeding: None.  Movement: Present. reports leaking of fluid/ROM.   The following portions of the patient's history were reviewed and updated as appropriate: allergies, current medications, past family history, past medical history, past social history, past surgical history and problem list. Problem list updated.  Objective:   Vitals:   11/18/18 0901  BP: 109/69  Temp: 97.9 F (36.6 C)  Weight: 174 lb 3.2 oz (79 kg)    Fetal Status: Fetal Heart Rate (bpm): 140 Fundal Height: 38 cm Movement: Present  Presentation: Vertex  General:  Alert, oriented and cooperative. Patient is in no acute distress.  Skin: Skin is warm and dry. No rash noted.   Cardiovascular: Normal heart rate noted  Respiratory: Normal respiratory effort, no problems with respiration noted  Abdomen: Soft, gravid, appropriate for gestational age.  Pain/Pressure: Absent     Pelvic: Cervical exam performed Dilation: 1 Effacement (%): Thick Station: -3. No pooling and pH<4.5. FERN NEGATIVE  Extremities: Normal range of motion.  Edema: None  Mental Status: Normal mood and affect. Normal behavior. Normal judgment and thought content.   Assessment and Plan:  Pregnancy: G3P1011 at [redacted]w[redacted]d  1. Anemia of mother in pregnancy, antepartum, second trimester Improving with therapy  2. Supervision of other normal pregnancy, antepartum Up to date Reviewed reasons to go to hospital  3. Vaginal discharge during pregnancy in third trimester Fern  negative, ph 4.5 (nitrazine negative), no pooling-- r/o ROM.Likely increased normal discharge. Wet prep neg - WET PREP FOR TRICH, YEAST, CLUE   Term labor symptoms and general obstetric precautions including but not limited to vaginal bleeding, contractions, leaking of fluid and fetal movement were reviewed in detail with the patient. Please refer to After Visit Summary for other counseling recommendations.  Return for Scheduled prenatal/NST.  Future Appointments  Date Time Provider [place]  11/26/2018 10:40 AM [facility] PROVIDER AC-[facility] None    Caren Macadam, MD

## 2018-11-21 ENCOUNTER — Ambulatory Visit: Payer: Self-pay

## 2018-11-25 ENCOUNTER — Telehealth: Payer: Self-pay

## 2018-11-25 NOTE — Telephone Encounter (Signed)
TC to patient about FMLA paperwork for partner. Paperwork is ready and ROI signed to fax, but no fax number. Need fax number or patient needs to pick up in person. LM with number to call. Interpreter, Dot [place].Jenetta Downer, RN

## 2018-11-25 NOTE — Telephone Encounter (Signed)
Client returned call and informed by Alinda Money, [facility] intake clerk, that FMLA paperwork for partner is available for pick up or fax number needed. As client has appt 11/26/2018, plans to retrieve FMLA paperwork at that time (note created on pink sticky). Rich Number, RN

## 2018-11-26 ENCOUNTER — Inpatient Hospital Stay
Admission: EM | Admit: 2018-11-26 | Discharge: 2018-11-28 | DRG: 807 | Disposition: A | Discharge status: Home / Self Care | Payer: Medicaid Other | Attending: Obstetrics & Gynecology | Admitting: Obstetrics & Gynecology

## 2018-11-26 ENCOUNTER — Other Ambulatory Visit: Payer: Self-pay

## 2018-11-26 ENCOUNTER — Ambulatory Visit: Payer: Medicaid Other | Admitting: Advanced Practice Midwife

## 2018-11-26 DIAGNOSIS — Z3A38 38 weeks gestation of pregnancy: Secondary | ICD-10-CM

## 2018-11-26 DIAGNOSIS — Z3480 Encounter for supervision of other normal pregnancy, unspecified trimester: Secondary | ICD-10-CM

## 2018-11-26 DIAGNOSIS — Z20828 Contact with and (suspected) exposure to other viral communicable diseases: Secondary | ICD-10-CM | POA: Diagnosis present

## 2018-11-26 DIAGNOSIS — O26893 Other specified pregnancy related conditions, third trimester: Secondary | ICD-10-CM | POA: Diagnosis present

## 2018-11-26 DIAGNOSIS — O99012 Anemia complicating pregnancy, second trimester: Secondary | ICD-10-CM

## 2018-11-26 DIAGNOSIS — O9902 Anemia complicating childbirth: Secondary | ICD-10-CM | POA: Diagnosis present

## 2018-11-26 DIAGNOSIS — D649 Anemia, unspecified: Secondary | ICD-10-CM | POA: Diagnosis present

## 2018-11-26 DIAGNOSIS — Z3483 Encounter for supervision of other normal pregnancy, third trimester: Secondary | ICD-10-CM | POA: Diagnosis not present

## 2018-11-26 MED ORDER — OXYTOCIN 40 UNITS IN NORMAL SALINE INFUSION - SIMPLE MED
2.5000 [IU]/h | INTRAVENOUS | Status: DC
  Filled 2018-11-26: qty 1000

## 2018-11-26 MED ORDER — ACETAMINOPHEN 500 MG PO TABS: 1000.0000 mg | ORAL_TABLET | Freq: Four times a day (QID) | ORAL | Status: DC | PRN

## 2018-11-26 MED ORDER — OXYTOCIN BOLUS FROM INFUSION
500.0000 mL | Freq: Once | INTRAVENOUS | Status: AC
  Administered 2018-11-27: 05:00:00 500 mL via INTRAVENOUS

## 2018-11-26 MED ORDER — LACTATED RINGERS IV SOLN: 500.0000 mL | INTRAVENOUS | Status: DC | PRN

## 2018-11-26 MED ORDER — LACTATED RINGERS IV SOLN
INTRAVENOUS | Status: DC
  Administered 2018-11-27 (×2): via INTRAVENOUS

## 2018-11-26 MED ORDER — ONDANSETRON HCL 4 MG/2ML IJ SOLN: 4.0000 mg | Freq: Four times a day (QID) | INTRAMUSCULAR | Status: DC | PRN

## 2018-11-26 MED ORDER — BUTORPHANOL TARTRATE 1 MG/ML IJ SOLN
1.0000 mg | INTRAMUSCULAR | Status: DC | PRN
  Administered 2018-11-27: 1 mg via INTRAVENOUS
  Filled 2018-11-26: qty 1

## 2018-11-26 MED ORDER — SOD CITRATE-CITRIC ACID 500-334 MG/5ML PO SOLN: 30.0000 mL | ORAL | Status: DC | PRN

## 2018-11-26 MED ORDER — LIDOCAINE HCL (PF) 1 % IJ SOLN: 30.0000 mL | INTRAMUSCULAR | Status: DC | PRN

## 2018-11-26 NOTE — OB Triage Note (Signed)
Pt is a G3P1 seen in triage at 38wk2d w/ c/o of ctx. Pt states her ctx started around 1900 last night and have progressively gotten worst. Pt rates her pain is a 7/10. Pt states she has bee spotting since yesterday.Pt denies LOF. Pt states positive fetal movement. FHT 159. Monitors applied and assessing.

## 2018-11-26 NOTE — Progress Notes (Signed)
Patient here for [facility] RV at 88 2/7. FMLA  Paperwork for patient's spouse given to patient today. Jenetta Downer, RN

## 2018-11-26 NOTE — Progress Notes (Signed)
   PRENATAL VISIT NOTE  Subjective:  Jane Brewer is a 23 y.o. G3P1011 at [redacted]w[redacted]d being seen today for ongoing prenatal care.  She is currently monitored for the following issues for this low-risk pregnancy and has Family history of intellectual disabilities; Anemia; Supervision of other normal pregnancy, antepartum; and Anemia of mother in pregnancy, antepartum, second trimester on their problem list.  Patient reports backache.  Contractions: Irregular. Vag. Bleeding: Bloody Show.  Movement: Present. Denies leaking of fluid/ROM.   The following portions of the patient's history were reviewed and updated as appropriate: allergies, current medications, past family history, past medical history, past social history, past surgical history and problem list. Problem list updated.  Objective:   Vitals:   11/26/18 1034  BP: 100/66  Temp: (!) 97.5 F (36.4 C)  Weight: 173 lb (78.5 kg)    Fetal Status: Fetal Heart Rate (bpm): 150 Fundal Height: 40 cm Movement: Present  Presentation: Vertex  General:  Alert, oriented and cooperative. Patient is in no acute distress.  Skin: Skin is warm and dry. No rash noted.   Cardiovascular: Normal heart rate noted  Respiratory: Normal respiratory effort, no problems with respiration noted  Abdomen: Soft, gravid, appropriate for gestational age.  Pain/Pressure: Absent     Pelvic: Cervical exam performed Dilation: 3 Effacement (%): 100 Station: -2  Extremities: Normal range of motion.  Edema: None  Mental Status: Normal mood and affect. Normal behavior. Normal judgment and thought content.   Assessment and Plan:  Pregnancy: G3P1011 at [redacted]w[redacted]d  1. Anemia of mother in pregnancy, antepartum, second trimester Taking FeSo4 I daily  2. Supervision of other normal pregnancy, antepartum Pt requesting stripping of membranes because irregular contractions since last night q 30-60 minutes Knows when to go to L&D   Term labor symptoms and general  obstetric precautions including but not limited to vaginal bleeding, contractions, leaking of fluid and fetal movement were reviewed in detail with the patient. Please refer to After Visit Summary for other counseling recommendations.  No follow-ups on file.  No future appointments.  Herbie Saxon, CNM

## 2018-11-26 NOTE — H&P (Signed)
OB History & Physical   History of Present Illness:  Chief Complaint:   HPI:  Mea Ozga is a 23 y.o. G22P1011 female at [redacted]w[redacted]d dated by Patient's last menstrual period was 03/03/2018. Estimated Date of Delivery: 12/08/18 .  She presents to L&D for   +FM, + CTX, no LOF, mild VB  Pregnancy Issues: 1. Anemia 2. Vitamin b12 deficiency  Maternal Medical History:   Past Medical History:  Diagnosis Date  . Acid reflux    with pregnancy  . Anemia    age 89 years and with first pregnancy  . Nausea    with pregnancy    Past Surgical History:  Procedure Laterality Date  . no surgical history      No Known Allergies  Prior to Admission medications   Medication Sig Start Date End Date Taking? Authorizing Provider  ferrous sulfate 325 (65 FE) MG tablet Take 1 tablet (325 mg total) by mouth daily with breakfast. 09/12/18  Yes Caren Macadam, MD  Prenatal Vit-Fe Fumarate-FA (PRENATAL MULTIVITAMIN) TABS tablet Take 1 tablet by mouth daily at 12 noon.   Yes [provider]  Prenatal Vit-Fe Fumarate-FA (PRENATAL VITAMINS) 28-0.8 MG TABS Take 1 tablet by mouth daily. 08/21/18 11/29/18 Yes Caren Macadam, MD     Prenatal care site: [facility]   Social History: She  reports that she has never smoked. She has never used smokeless tobacco. She reports previous alcohol use. She reports that she does not use drugs.  Family History: family history includes Cirrhosis in her maternal grandmother; Diabetes in her maternal grandfather; Epilepsy in her maternal uncle; Hypertension in her mother; Miscarriages / Stillbirths in her maternal aunt; Multiple births in her maternal aunt and mother.   Review of Systems: A full review of systems was performed and negative except as noted in the HPI.     Physical Exam:  Vital Signs: LMP 03/03/2018  General: no acute distress.  HEENT: normocephalic, atraumatic Heart: regular rate & rhythm.  No  murmurs/rubs/gallops Lungs: clear to auscultation bilaterally, normal respiratory effort Abdomen: soft, gravid, non-tender;  EFW: 7.2lbs Pelvic:   External: Normal external female genitalia  Cervix: Dilation: 4 / Effacement (%): 80, 90 / Station: -2    Extremities: non-tender, symmetric, mild edema bilaterally.  DTRs: 2+ Neurologic: Alert & oriented x 3.      Pertinent Results:  Prenatal Labs: Blood type/Rh A+  Antibody screen neg  Rubella Immune  Varicella Immune  RPR NR  HBsAg Neg  HIV NR  GC neg  Chlamydia neg  Genetic screening Not done  1 hour GTT 102  3 hour GTT --  GBS negative   FHT: 150 mod + accels no decels TOCO: q61min  SVE:  Dilation: 4 / Effacement (%): 80, 90 / Station: -2    Cephalic by leopolds   Assessment:  Zully Frane is a 23 y.o. G16P1011 female at [redacted]w[redacted]d with labor.   Plan:  1. Admit to Labor & Delivery 2. CBC, T&S, Clrs, IVF 3. GBS  neg 4. Consents obtained. 5. Continuous efm/toco 6. Expectant management 7. Category 1 tracing  ----- Larey Days, MD Attending Obstetrician and Gynecologist [facility], Department of [facility]

## 2018-11-27 ENCOUNTER — Encounter: Payer: Self-pay | Admitting: Anesthesiology

## 2018-11-27 ENCOUNTER — Inpatient Hospital Stay: Payer: Medicaid Other | Admitting: Anesthesiology

## 2018-11-27 DIAGNOSIS — Z3483 Encounter for supervision of other normal pregnancy, third trimester: Secondary | ICD-10-CM | POA: Diagnosis not present

## 2018-11-27 LAB — TYPE AND SCREEN
ABO/RH(D): A POS
Antibody Screen: NEGATIVE

## 2018-11-27 LAB — RPR: RPR Ser Ql: NONREACTIVE

## 2018-11-27 LAB — CBC
HCT: 33.7 % — ABNORMAL LOW (ref 36.0–46.0)
Hemoglobin: 11.3 g/dL — ABNORMAL LOW (ref 12.0–15.0)
MCH: 30.9 pg (ref 26.0–34.0)
MCHC: 33.5 g/dL (ref 30.0–36.0)
MCV: 92.1 fL (ref 80.0–100.0)
Platelets: 215 10*3/uL (ref 150–400)
RBC: 3.66 MIL/uL — ABNORMAL LOW (ref 3.87–5.11)
RDW: 13.2 % (ref 11.5–15.5)
WBC: 10.8 10*3/uL — ABNORMAL HIGH (ref 4.0–10.5)
nRBC: 0 % (ref 0.0–0.2)

## 2018-11-27 LAB — SARS CORONAVIRUS 2 BY RT PCR (HOSPITAL ORDER, PERFORMED IN ~~LOC~~ HOSPITAL LAB): SARS Coronavirus 2: NEGATIVE

## 2018-11-27 MED ORDER — BUPIVACAINE HCL (PF) 0.25 % IJ SOLN
INTRAMUSCULAR | Status: DC | PRN
  Administered 2018-11-27: 5 mL via EPIDURAL

## 2018-11-27 MED ORDER — OXYTOCIN 10 UNIT/ML IJ SOLN
INTRAMUSCULAR | Status: AC
  Filled 2018-11-27: qty 2

## 2018-11-27 MED ORDER — WITCH HAZEL-GLYCERIN EX PADS
1.0000 "application " | MEDICATED_PAD | CUTANEOUS | Status: DC
  Administered 2018-11-27: 1 via TOPICAL
  Filled 2018-11-27: qty 100

## 2018-11-27 MED ORDER — IBUPROFEN 600 MG PO TABS
600.0000 mg | ORAL_TABLET | Freq: Four times a day (QID) | ORAL | Status: DC
  Administered 2018-11-27 – 2018-11-28 (×4): 600 mg via ORAL
  Filled 2018-11-27 (×5): qty 1

## 2018-11-27 MED ORDER — FENTANYL 2.5 MCG/ML W/ROPIVACAINE 0.15% IN NS 100 ML EPIDURAL (ARMC)
EPIDURAL | Status: AC
  Filled 2018-11-27: qty 100

## 2018-11-27 MED ORDER — DIBUCAINE (PERIANAL) 1 % EX OINT: 1.0000 "application " | TOPICAL_OINTMENT | CUTANEOUS | Status: DC | PRN

## 2018-11-27 MED ORDER — PRENATAL MULTIVITAMIN CH
1.0000 | ORAL_TABLET | Freq: Every day | ORAL | Status: DC
  Administered 2018-11-27 – 2018-11-28 (×2): 1 via ORAL
  Filled 2018-11-27 (×2): qty 1

## 2018-11-27 MED ORDER — COCONUT OIL OIL: 1.0000 "application " | TOPICAL_OIL | Status: DC | PRN

## 2018-11-27 MED ORDER — ONDANSETRON HCL 4 MG PO TABS: 4.0000 mg | ORAL_TABLET | ORAL | Status: DC | PRN

## 2018-11-27 MED ORDER — EPHEDRINE 5 MG/ML INJ: 10.0000 mg | INTRAVENOUS | Status: DC | PRN

## 2018-11-27 MED ORDER — LACTATED RINGERS IV SOLN: 500.0000 mL | Freq: Once | INTRAVENOUS | Status: DC

## 2018-11-27 MED ORDER — DIPHENHYDRAMINE HCL 25 MG PO CAPS: 25.0000 mg | ORAL_CAPSULE | Freq: Four times a day (QID) | ORAL | Status: DC | PRN

## 2018-11-27 MED ORDER — DOCUSATE SODIUM 100 MG PO CAPS
100.0000 mg | ORAL_CAPSULE | Freq: Two times a day (BID) | ORAL | Status: DC
  Administered 2018-11-27 – 2018-11-28 (×2): 100 mg via ORAL
  Filled 2018-11-27 (×2): qty 1

## 2018-11-27 MED ORDER — FENTANYL 2.5 MCG/ML W/ROPIVACAINE 0.15% IN NS 100 ML EPIDURAL (ARMC)
12.0000 mL/h | EPIDURAL | Status: DC
  Administered 2018-11-27: 03:00:00 12 mL/h via EPIDURAL

## 2018-11-27 MED ORDER — DIPHENHYDRAMINE HCL 50 MG/ML IJ SOLN: 12.5000 mg | INTRAMUSCULAR | Status: DC | PRN

## 2018-11-27 MED ORDER — MISOPROSTOL 200 MCG PO TABS
ORAL_TABLET | ORAL | Status: AC
  Filled 2018-11-27: qty 4

## 2018-11-27 MED ORDER — BENZOCAINE-MENTHOL 20-0.5 % EX AERO
1.0000 "application " | INHALATION_SPRAY | CUTANEOUS | Status: DC | PRN
  Administered 2018-11-27: 1 via TOPICAL
  Filled 2018-11-27: qty 56

## 2018-11-27 MED ORDER — ONDANSETRON HCL 4 MG/2ML IJ SOLN: 4.0000 mg | INTRAMUSCULAR | Status: DC | PRN

## 2018-11-27 MED ORDER — SIMETHICONE 80 MG PO CHEW: 80.0000 mg | CHEWABLE_TABLET | ORAL | Status: DC | PRN

## 2018-11-27 MED ORDER — PHENYLEPHRINE 40 MCG/ML (10ML) SYRINGE FOR IV PUSH (FOR BLOOD PRESSURE SUPPORT): 80.0000 ug | PREFILLED_SYRINGE | INTRAVENOUS | Status: DC | PRN

## 2018-11-27 MED ORDER — ACETAMINOPHEN 500 MG PO TABS
1000.0000 mg | ORAL_TABLET | Freq: Four times a day (QID) | ORAL | Status: DC | PRN
  Administered 2018-11-27: 1000 mg via ORAL
  Filled 2018-11-27: qty 2

## 2018-11-27 MED ORDER — LIDOCAINE HCL (PF) 1 % IJ SOLN
INTRAMUSCULAR | Status: AC
  Filled 2018-11-27: qty 30

## 2018-11-27 MED ORDER — AMMONIA AROMATIC IN INHA
RESPIRATORY_TRACT | Status: AC
  Filled 2018-11-27: qty 10

## 2018-11-27 NOTE — Anesthesia Preprocedure Evaluation (Signed)
Anesthesia Evaluation  Patient identified by MRN, date of birth, ID band Patient awake    Reviewed: Allergy & Precautions, NPO status , Patient's Chart, lab work & pertinent test results, reviewed documented beta blocker date and time   Airway Mallampati: II  TM Distance: >3 FB     Dental  (+) Chipped   Pulmonary           Cardiovascular      Neuro/Psych    GI/Hepatic GERD  Controlled,  Endo/Other    Renal/GU      Musculoskeletal   Abdominal   Peds  Hematology  (+) anemia ,   Anesthesia Other Findings   Reproductive/Obstetrics                             Anesthesia Physical Anesthesia Plan  ASA: II  Anesthesia Plan: Epidural   Post-op Pain Management:    Induction: Intravenous  PONV Risk Score and Plan:   Airway Management Planned:   Additional Equipment:   Intra-op Plan:   Post-operative Plan:   Informed Consent: I have reviewed the patients History and Physical, chart, labs and discussed the procedure including the risks, benefits and alternatives for the proposed anesthesia with the patient or authorized representative who has indicated his/her understanding and acceptance.       Plan Discussed with: CRNA  Anesthesia Plan Comments:         Anesthesia Quick Evaluation

## 2018-11-27 NOTE — Lactation Note (Signed)
This note was copied from a baby's chart. Lactation Consultation Note  Patient Name: Jane Brewer Date: 11/27/2018 Reason for consult: Initial assessment  LC met with mom and baby Jane Brewer. Jane Brewer was actively breastfeeding on right breast in football position, turned slightly out. Mom had no pain or discomfort, however LC asked permission before turning baby in. [place] provided guidance on importance of good position of baby while breastfeeding to ensure minimization of pain or discomfort. Baby displayed flanged top and bottom lips, and had spontaneous swallows following strong rhythmic sucking patterns.  Encouraged mom to bring baby to breast instead of breast to baby. LC helped mom readjust her position, and raised the head of the bed for more back support, mom thankful for the back support. Provided basic breastfeeding education: newborn stomach size, feeding patterns, keeping alert at the breast, breast massage/compression, position/latch, and wet/stool diaper expectation in first 24 hours. [facility] name and number is written on whiteboard. Parents encouraged to call out for questions/concerns, and for assistance if needed with breastfeeding.  Maternal Data Formula Feeding for Exclusion: No Has patient been taught Hand Expression?: Yes Does the patient have breastfeeding experience prior to this delivery?: Yes  Feeding Feeding Type: Breast Fed  LATCH Score Latch: Repeated attempts needed to sustain latch, nipple held in mouth throughout feeding, stimulation needed to elicit sucking reflex.  Audible Swallowing: Spontaneous and intermittent  Type of Nipple: Everted at rest and after stimulation  Comfort (Breast/Nipple): Soft / non-tender  Hold (Positioning): Assistance needed to correctly position infant at breast and maintain latch.  LATCH Score: 8  Interventions Interventions: Breast feeding basics reviewed;Adjust position;Support pillows  Lactation Tools  Discussed/Used     Consult Status Consult Status: Follow-up Date: 11/27/18 Follow-up type: In-patient    Lavonia Drafts 11/27/2018, 4:17 PM

## 2018-11-27 NOTE — Discharge Summary (Signed)
Obstetrical Discharge Summary  Patient Name: Jane Brewer DOB: 17-Feb-1995 MRN: [id number]  Date of Admission: 11/26/2018 Date of Delivery: 11/27/2018 Delivered by: Larey Days, MD Date of Discharge: 11/28/2018  Primary OB:  [facility] [id number] last menstrual period was 03/03/2018. EDC Estimated Date of Delivery: 12/08/18 Gestational Age at Delivery: [redacted]w[redacted]d   Antepartum complications:  1. Anemia 2. Vitamin B12 deficiency  Admitting Diagnosis: Labor Secondary Diagnosis: Patient Active Problem List   Diagnosis Date Noted  . Labor and delivery indication for care or intervention 11/26/2018  . Anemia of mother in pregnancy, antepartum, second trimester 09/12/2018  . Supervision of other normal pregnancy, antepartum 08/21/2018  . Anemia   . Family history of intellectual disabilities     Augmentation: AROM Complications: None Intrapartum complications/course: Mom presented to L&D with labor.  epidual placed. Progressed to complete, second stage: 10 minutes.  delivery of fetal head with restitution to ROT.   Anterior then posterior shoulders delivered without difficulty.  Baby placed on mom's chest, and attended to by peds.  Cord was then clamped and cut by FOB when pulseless.  Placenta spontaneously delivered, intact.   IV pitocin given for hemorrhage prophylaxis. Delivery Type: spontaneous vaginal delivery Anesthesia: epidural Placenta: spontaneous, intact Laceration: none Episiotomy: none Newborn Data: Live born female "Jane Brewer" Birth Weight:  3450g APGAR: 9, 10  Newborn Delivery   Birth date/time: 11/27/2018 04:38:00 Delivery type: Vaginal, Spontaneous      Postpartum Procedures: none  Post partum course:  Patient had an uncomplicated postpartum course.  By time of discharge on PPD#1, her pain was controlled on oral pain medications; she had appropriate lochia and was ambulating, voiding without difficulty and tolerating regular diet.  She was deemed stable  for discharge to home.     Discharge Physical Exam:  BP 109/73   Pulse 71   Temp 98.4 F (36.9 C) (Oral)   Resp 20   Ht 5\' 3"  (1.6 m)   Wt 78.5 kg   LMP 03/03/2018   SpO2 100%   Breastfeeding Unknown   BMI 30.65 kg/m   General: NAD CV: RRR Pulm: CTABL, nl effort ABD: s/nd/nt, fundus firm and below the umbilicus Lochia: moderate DVT Evaluation: LE non-ttp, no evidence of DVT on exam.  Hemoglobin  Date Value Ref Range Status  11/28/2018 10.6 (L) 12.0 - 15.0 g/dL Final  09/12/2018 10.5 (L) 11.1 - 15.9 g/dL Final  04/29/2018 12.6  Final   HCT  Date Value Ref Range Status  11/28/2018 31.6 (L) 36.0 - 46.0 % Final   Hematocrit  Date Value Ref Range Status  09/12/2018 31.2 (L) 34.0 - 46.6 % Final     Disposition: stable, discharge to home. Baby Feeding: breastmilk  Baby Disposition: home with mom  Rh Immune globulin given: n/a Rubella vaccine given: n/a Flu vaccine given in AP or PP setting: AP  Contraception: condoms  Prenatal Labs:   Blood type/Rh A+  Antibody screen neg  Rubella Immune  Varicella Immune  RPR NR  HBsAg Neg  HIV NR  GC neg  Chlamydia neg  Genetic screening Not done  1 hour GTT 102  3 hour GTT --  GBS negative     Plan:  Alvilda Mckenna was discharged to home in good condition. Follow-up appointment with Dr. Leonides Schanz in 6 weeks.  Discharge Medications: Allergies as of 11/28/2018   No Known Allergies     Medication List    TAKE these medications   docusate sodium 100 MG capsule Commonly known as: Colace  Take 1 capsule (100 mg total) by mouth 2 (two) times daily for 14 days. To keep stools soft, as needed   ferrous sulfate 325 (65 FE) MG tablet Take 1 tablet (325 mg total) by mouth daily with breakfast.   ibuprofen 800 MG tablet Commonly known as: ADVIL Take 1 tablet (800 mg total) by mouth every 8 (eight) hours as needed for moderate pain or cramping.   prenatal multivitamin Tabs tablet Take 1 tablet by mouth  daily at 12 noon.   Prenatal Vitamins 28-0.8 MG Tabs Take 1 tablet by mouth daily.       Follow-up Information    Ward, Elenora Fender, MD Follow up in 6 week(s).   Specialty: Obstetrics and Gynecology Contact information: [address] [place] [address] [phone]           Signed: Christeen Douglas 11/28/18

## 2018-11-27 NOTE — Anesthesia Procedure Notes (Signed)
Epidural Patient location during procedure: OB  Staffing Anesthesiologist: Norvella Loscalzo, MD Performed: anesthesiologist   Preanesthetic Checklist Completed: patient identified, site marked, surgical consent, pre-op evaluation, timeout performed, IV checked, risks and benefits discussed and monitors and equipment checked  Epidural Patient position: sitting Prep: ChloraPrep Patient monitoring: heart rate, continuous pulse ox and blood pressure Approach: midline Location: L4-L5 Injection technique: LOR saline  Needle:  Needle type: Tuohy  Needle gauge: 18 G Needle length: 9 cm and 9 Catheter type: closed end flexible Catheter size: 20 Guage Test dose: negative and 1.5% lidocaine with Epi 1:200 K  Assessment Sensory level: T10 Events: blood not aspirated, injection not painful, no injection resistance, negative IV test and no paresthesia  Additional Notes   Patient tolerated the insertion well without complications.Reason for block:procedure for pain     

## 2018-11-28 LAB — CBC
HCT: 31.6 % — ABNORMAL LOW (ref 36.0–46.0)
Hemoglobin: 10.6 g/dL — ABNORMAL LOW (ref 12.0–15.0)
MCH: 30.9 pg (ref 26.0–34.0)
MCHC: 33.5 g/dL (ref 30.0–36.0)
MCV: 92.1 fL (ref 80.0–100.0)
Platelets: 191 10*3/uL (ref 150–400)
RBC: 3.43 MIL/uL — ABNORMAL LOW (ref 3.87–5.11)
RDW: 13 % (ref 11.5–15.5)
WBC: 13.6 10*3/uL — ABNORMAL HIGH (ref 4.0–10.5)
nRBC: 0 % (ref 0.0–0.2)

## 2018-11-28 MED ORDER — DOCUSATE SODIUM 100 MG PO CAPS: 100.0000 mg | ORAL_CAPSULE | Freq: Two times a day (BID) | ORAL | 0 refills | Status: AC

## 2018-11-28 MED ORDER — IBUPROFEN 800 MG PO TABS: 800.0000 mg | ORAL_TABLET | Freq: Three times a day (TID) | ORAL | 1 refills | Status: AC | PRN

## 2018-11-28 NOTE — Progress Notes (Signed)
Discharge instructions, prescriptions, education, and appointments given and explained. Pt verbalized understanding with no further questions. Pt wheeled to personal vehicle via wheelchair by staff.

## 2018-11-28 NOTE — Anesthesia Postprocedure Evaluation (Signed)
Anesthesia Post Note  Patient: Jane Brewer  Procedure(s) Performed: AN AD [place]  Patient location during evaluation: Mother Baby Anesthesia Type: Epidural Level of consciousness: awake and alert Pain management: pain level controlled Vital Signs Assessment: post-procedure vital signs reviewed and stable Respiratory status: spontaneous breathing, nonlabored ventilation and respiratory function stable Cardiovascular status: stable Postop Assessment: no headache, no backache and epidural receding Anesthetic complications: no     Last Vitals:  Vitals:   11/27/18 2352 11/28/18 0854  BP: (!) 100/59 109/73  Pulse: (!) 57 71  Resp: 20   Temp: (!) 36.4 C 36.9 C  SpO2: 100% 100%    Last Pain:  Vitals:   11/28/18 0854  TempSrc: Oral  PainSc:                  Brantley Fling

## 2018-11-28 NOTE — Discharge Instructions (Signed)
Parto vaginal, cuidados de puerperio Postpartum Care After Vaginal Delivery Lea esta informacin sobre cmo cuidarse desde el momento en que nazca su beb y hasta 6 a 12 semanas despus del parto (perodo del posparto). El mdico tambin podr darle instrucciones ms especficas. Comunquese con su mdico si tiene problemas o preguntas. Siga estas indicaciones en su casa: Hemorragia vaginal  Es normal tener un poco de hemorragia vaginal (loquios) despus del parto. Use un apsito sanitario para el sangrado vaginal y secrecin. ? Durante la primera semana despus del parto, la cantidad y el aspecto de los loquios a menudo es similar a las del perodo menstrual. ? Durante las siguientes semanas disminuir gradualmente hasta convertirse en una secrecin seca amarronada o amarillenta. ? En la mayora de las mujeres, los loquios se detienen completamente entre 4 a 6semanas despus del parto. Los sangrados vaginales pueden variar de mujer a mujer.  Cambie los apsitos sanitarios con frecuencia. Observe si hay cambios en el flujo, como: ? Un aumento repentino en el volumen. ? Cambio en el color. ? Cogulos sanguneos grandes.  Si expulsa un cogulo de sangre por la vagina, gurdelo y llame al mdico para informrselo. No deseche los cogulos de sangre por el inodoro antes de hablar con su mdico.  No use tampones ni se haga duchas vaginales hasta que el mdico la autorice.  Si no est amamantando, volver a tener su perodo entre 6 y 8 semanas despus del parto. Si solamente alimenta al beb con leche materna (lactancia materna exclusiva), podra no volver a tener su perodo hasta que deje de amamantar. Cuidados perineales  Mantenga la zona entre la vagina y el ano (perineo) limpia y seca, como se lo haya indicado el mdico. Utilice apsitos o aerosoles analgsicos y cremas, como se lo hayan indicado.  Si le hicieron un corte en el perineo (episiotoma) o tuvo un desgarro en la vagina, controle la  zona para detectar signos de infeccin hasta que sane. Est atenta a los siguientes signos: ? Aumento del enrojecimiento, la hinchazn o el dolor. ? Presenta lquido o sangre que supura del corte o desgarro. ? Calor. ? Pus o mal olor.  Es posible que le den una botella rociadora para que use en lugar de limpiarse el rea con papel higinico despus de usar el bao. Cuando comience a sanar, podr usar la botella rociadora antes de secarse. Asegrese de secarse suavemente.  Para aliviar el dolor causado por una episiotoma, un desgarro en la vagina o venas hinchadas en el ano (hemorroides), trate de tomar un bao de asiento tibio 2 o 3 veces por da. Un bao de asiento es un bao de agua tibia que se toma mientras se est sentado. El agua solo debe llegar hasta las caderas y cubrir las nalgas. Cuidado de las mamas  En los primeros das despus del parto, las mamas pueden sentirse pesadas, llenas e incmodas (congestin mamaria). Tambin puede escaparse leche de sus senos. El mdico puede sugerirle mtodos para aliviar este malestar. La congestin mamaria debera desaparecer al cabo de unos das.  Si est amamantando: ? Use un sostn que sujete y ajuste bien sus pechos. ? Mantenga los pezones secos y limpios. Aplquese cremas y ungentos, como se lo haya indicado el mdico. ? Es posible que deba usar discos de algodn en el sostn para absorber la leche que se filtre de sus senos. ? Puede tener contracciones uterinas cada vez que amamante durante varias semanas despus del parto. Las contracciones uterinas ayudan al tero a   regresar a su tamao habitual. ? Si tiene algn problema con la lactancia materna, colabore con el mdico o un asesor en lactancia.  Si no est amamantando: ? Evite tocarse mucho las mamas. Al hacerlo, podran producir ms leche. ? Use un sostn que le proporcione el ajuste correcto y compresas fras para reducir la hinchazn. ? No extraiga (saque) leche materna. Esto har que  produzca ms leche. Intimidad y sexualidad  Pregntele al mdico cundo puede retomar la actividad sexual. Esto puede depender de lo siguiente: ? Su riesgo de sufrir infecciones. ? La rapidez con la que est sanando. ? Su comodidad y deseo de retomar la actividad sexual.  Despus del parto, puede quedar embarazada incluso si no ha tenido todava su perodo. Si lo desea, hable con el mdico acerca de los mtodos de control de la natalidad (mtodos anticonceptivos). Medicamentos  Tome los medicamentos de venta libre y los recetados solamente como se lo haya indicado el mdico.  Si le recetaron un antibitico, tmelo como se lo haya indicado el mdico. No deje de tomar el antibitico aunque comience a sentirse mejor. Actividad  Retome sus actividades normales de a poco como se lo haya indicado el mdico. Pregntele al mdico qu actividades son seguras para usted.  Descanse todo lo que pueda. Trate de descansar o tomar una siesta mientras el beb duerme. Comida y bebida   Beba suficiente lquido como para mantener la orina de color amarillo plido.  Coma alimentos ricos en fibras todos los das. Estos pueden ayudarla a prevenir o aliviar el estreimiento. Los alimentos ricos en fibras incluyen, entre otros: ? Panes y cereales integrales. ? Arroz integral. ? Frijoles. ? Frutas y verduras frescas.  No intente perder de peso rpidamente reduciendo el consumo de caloras.  Tome sus vitaminas prenatales hasta la visita de seguimiento de posparto o hasta que su mdico le indique que puede dejar de tomarlas. Estilo de vida  No consuma ningn producto que contenga nicotina o tabaco, como cigarrillos y cigarrillos electrnicos. Si necesita ayuda para dejar de fumar, consulte al mdico.  No beba alcohol, especialmente si est amamantando. Instrucciones generales  Concurra a todas las visitas de seguimiento para usted y el beb, como se lo haya indicado el mdico. La mayora de las mujeres  visita al mdico para un seguimiento de posparto dentro de las primeras 3 a 6 semanas despus del parto. Comunquese con un mdico si:  Se siente incapaz de controlar los cambios que implica tener un hijo y esos sentimientos no desaparecen.  Siente tristeza o preocupacin de forma inusual.  Las mamas se ponen rojas, le duelen o se endurecen.  Tiene fiebre.  Tiene dificultad para retener la orina o para impedir que la orina se escape.  Tiene poco inters o falta de inters en actividades que solan gustarle.  No ha amamantado nada y no ha tenido un perodo menstrual durante 12 semanas despus del parto.  Dej de amamantar al beb y no ha tenido su perodo menstrual durante 12 semanas despus de dejar de amamantar.  Tiene preguntas sobre su cuidado y el del beb.  Elimina un cogulo de sangre grande por la vagina. Solicite ayuda de inmediato si:  Siente dolor en el pecho.  Tiene dificultad para respirar.  Tiene un dolor repentino e intenso en la pierna.  Tiene dolor intenso o clicos en el la parte inferior del abdomen.  Tiene una hemorragia tan intensa de la vagina que empapa ms de un apsito en una hora. El sangrado   no debe ser ms abundante que el perodo ms intenso que haya tenido.  Dolor de cabeza intenso.  Se desmaya.  Tiene visin borrosa o Geologist, engineering.  Tiene secrecin vaginal con mal olor.  Tiene pensamientos acerca de lastimarse a usted misma o a su beb. Si alguna vez siente que puede lastimarse a usted misma o a Producer, television/film/video, o tiene pensamientos de poner fin a su vida, busque ayuda de inmediato. Puede dirigirse al departamento de emergencias ms cercano o llamar a:  El servicio de emergencias de su localidad (911 en [place]  Una lnea de asistencia al suicida y Freight forwarder en crisis, como la [employer] de Prevencin del Suicidio (National Suicide Prevention Lifeline), al (660)745-0274. Est disponible las 24 horas del da. Resumen  El  perodo de tiempo justo despus el parto y [address] a 12 semanas despus del parto se denomina perodo posparto.  Retome sus actividades normales de a Theme park manager se lo haya indicado el mdico.  Concurra a todas las visitas de seguimiento para usted y Photographer beb, como se lo haya indicado el mdico. Esta informacin no tiene Marine scientist el consejo del mdico. Asegrese de hacerle al mdico cualquier pregunta que tenga. Document Released: 11/26/2006 Document Revised: 05/11/2017 Document Reviewed: 01/20/2017 Elsevier Patient Education  2020 [employer].   Discharge instructions:   Call office if you have any of the following: headache, visual changes, fever >101.0 F, chills, shortness of breath, breast concerns, excessive vaginal bleeding, incision drainage or problems, leg pain or redness, depression or any other concerns.   Activity: Do not lift > 10 lbs for 6 weeks.  No intercourse or tampons for 6 weeks.  No driving for 1-2 weeks.   Call your doctor for increased pain or vaginal bleeding, temperature above 101.0, depression, or concerns.  No strenuous activity or heavy lifting for 6 weeks.  No intercourse, tampons, douching, or enemas for 6 weeks.  No tub baths-showers only.  No driving for 2 weeks or while taking pain medications.  Continue prenatal vitamin and iron.  Increase calories and fluids while breastfeeding.  You may have a slight fever when your milk comes in, but it should go away on its own.  If it does not, and rises above 101.0 please call the doctor.  For concerns about your baby, please call your pediatrician For breastfeeding concerns, the lactation consultant can be reached at [phone]

## 2018-12-03 ENCOUNTER — Ambulatory Visit: Payer: Medicaid Other | Admitting: Advanced Practice Midwife

## 2018-12-03 ENCOUNTER — Other Ambulatory Visit: Payer: Self-pay

## 2018-12-03 NOTE — Progress Notes (Signed)
23 yo HF [id number] with SVD 11/27/18 M 7#4 breastfeeding well.  Here because had appt (for prenatal appt?).  Doing well but tired.  Wants IUD pp for birth control. Counseled no sex until gets IUD.  To ;make pp appt

## 2018-12-03 NOTE — Progress Notes (Signed)
Patient here after 11/27/2018 SVD, thought she needed to come for this appointment because it was on her hospital discharge papers. Patient is breastfeeding and states it's going well.Jenetta Downer, RN

## 2019-01-06 ENCOUNTER — Encounter: Payer: Self-pay | Admitting: Family Medicine

## 2019-01-06 ENCOUNTER — Ambulatory Visit: Payer: Medicaid Other | Admitting: Family Medicine

## 2019-01-06 ENCOUNTER — Ambulatory Visit: Payer: Medicaid Other

## 2019-01-06 ENCOUNTER — Other Ambulatory Visit: Payer: Self-pay

## 2019-01-06 DIAGNOSIS — Z30430 Encounter for insertion of intrauterine contraceptive device: Secondary | ICD-10-CM

## 2019-01-06 DIAGNOSIS — O99012 Anemia complicating pregnancy, second trimester: Secondary | ICD-10-CM

## 2019-01-06 DIAGNOSIS — Z975 Presence of (intrauterine) contraceptive device: Secondary | ICD-10-CM

## 2019-01-06 DIAGNOSIS — Z3480 Encounter for supervision of other normal pregnancy, unspecified trimester: Secondary | ICD-10-CM

## 2019-01-06 DIAGNOSIS — Z3009 Encounter for other general counseling and advice on contraception: Secondary | ICD-10-CM | POA: Diagnosis not present

## 2019-01-06 LAB — HEMOGLOBIN, FINGERSTICK: Hemoglobin: 13.5 g/dL (ref 11.1–15.9)

## 2019-01-06 MED ORDER — THERA VITAL M PO TABS: 1.0000 | ORAL_TABLET | Freq: Every day | ORAL | 0 refills | Status: AC

## 2019-01-06 MED ORDER — LEVONORGESTREL 20 MCG/24HR IU IUD
1.0000 | INTRAUTERINE_SYSTEM | Freq: Once | INTRAUTERINE | Status: AC
  Administered 2019-01-06: 1 via INTRAUTERINE

## 2019-01-06 NOTE — Progress Notes (Addendum)
Post Partum Exam  Jane Brewer is a 23 y.o. [phone] female who presents for a postpartum visit. She is 6 weeks postpartum following a spontaneous vaginal delivery. I have fully reviewed the prenatal and intrapartum course. The delivery was at [redacted]w[redacted]d gestational weeks.  Anesthesia: epidural. Postpartum course has been uncomplicated. Baby's course has been uncomplicated. Baby is feeding by breast. Bleeding no bleeding. Bowel function is normal. Bladder function is normal. Patient is not sexually active. Contraception method is IUD.   Postpartum depression screening:  Edinburgh Postnatal Depression Scale - 01/06/19 1113      Edinburgh Postnatal Depression Scale:  In the Past 7 Days   I have been able to laugh and see the funny side of things.  0    I have looked forward with enjoyment to things.  0    I have blamed myself unnecessarily when things went wrong.  0    I have been anxious or worried for no good reason.  0    I have felt scared or panicky for no good reason.  0    Things have been getting on top of me.  0    I have been so unhappy that I have had difficulty sleeping.  0    I have felt sad or miserable.  0    I have been so unhappy that I have been crying.  0    The thought of harming myself has occurred to me.  0    Edinburgh Postnatal Depression Scale Total  0       The following portions of the patient's history were reviewed and updated as appropriate: allergies, current medications, past family history, past medical history, past social history, past surgical history and problem list. Last pap smear done 2018 and was Normal  Review of Systems Pertinent items are noted in HPI.    Objective:  BP 117/71   Wt 158 lb 3.2 oz (71.8 kg)   LMP  (LMP Unknown)   Breastfeeding Yes Comment: No period since birth of baby  BMI 28.02 kg/m   Gen: well appearing, NAD HEENT: no scleral icterus CV: RR Lung: Normal WOB Breast: No concerns noted by patient.  Ext: warm  well perfused  GU: Normal appearing external genitalia. Muliparous appearing cervix.  IUD Insertion Procedure Note Patient identified, informed consent performed, consent signed. Discussed risks of irregular bleeding, cramping, infection, malpositioning or misplacement of the IUD outside the uterus which may require further procedure such as laparoscopy. Time out was performed.    Speculum placed in the vagina.  Cervix visualized.  Cleaned with Betadine x 3.   Uterus sounded to 9 cm.  IUD placed per manufacturer's recommendations.  Strings trimmed to 3 cm.  Patient tolerated procedure well.   Patient was given post-procedure instructions- both agency handout and verbally by provider.  She was advised to have backup contraception for one week.         Assessment:    Normal postpartum exam. Pap smear not done at today's visit.   Plan:   1. Contraception: IUD- LNG. Discussed cu vs LNG IUD today and patient worried about increased bleeding and cramping. Reviewed that cu IUD is associated with these SE and patient opted for LNG IUD. Reviewed 7 yrs of continuous use if desired.  Counseled on effectiveness of IUDs and if concern for pregnancy she should take a pregnancy test. Recommended checking strings 3-4 weeks after placement and if unable to feel to have appointment at [facility].  2. Infant feeding:  patient is currently feeding with exclusive breastmilk- reviewed providing breastmilk only for 6 months and then continued as long as mother and child desire.  If breastmilk feeding and returning to work,  patient was given letter for employer to provide appropriate pumping time to express breastmilk.   -Recommended patient engage with WIC/BFpeer counselors  -Counseled to sign new child up for [facility] services 3. Mood: EPDS is low risk. Reviewed resources and that mood sx in first year after pregnancy are considered related to pregnancy and to reach out for help at [facility] if needed. Discussed [facility] as link to  care and availability of LCSW for counseling  4. Chronic Medical Conditions:  Anemia- hgb today  STI screening- last GC/CT was in Sept  2020 and last sex prior to that. Repeat  screening not indicated.   Patient given handout about PCP care in the community Given MVI per family planning program guidelines and availability  Follow up in: 3 weeks or as needed for string check

## 2019-01-06 NOTE — Progress Notes (Signed)
Patient Hgb reviewed, no treatment indicated. Patient given MVI to start after she finishes her PNV.Marland KitchenMarland KitchenJenetta Downer, RN

## 2019-01-06 NOTE — Addendum Note (Signed)
Addended by: Jenetta Downer on: 01/06/2019 12:12 PM   Modules accepted: Orders

## 2019-01-06 NOTE — Progress Notes (Signed)
Patient here for PP exam and considering IUD for [facility], unsure what type. Patient states breastfeeding is going well. Patient unsure what type IUD she wants, states when she was 59, she had a very heavy period for 3 months, and had to take a pill to make it stop. OC did not work. This was a 1 time event. Needs Hgb check today.Jenetta Downer, RN

## 2019-05-08 ENCOUNTER — Ambulatory Visit: Payer: Medicaid Other | Attending: Internal Medicine

## 2019-05-08 DIAGNOSIS — Z23 Encounter for immunization: Secondary | ICD-10-CM

## 2019-05-08 NOTE — Progress Notes (Signed)
   Covid-19 Vaccination Clinic  Name:  Levonne Carreras    MRN: [id number] DOB: [birth year]  05/08/2019  Ms. Kamyla Olejnik was observed post Covid-19 immunization for 15 minutes without incident. She was provided with Vaccine Information Sheet and instruction to access the V-Safe system. Medical Interpreter used.  Ms. Ysenia Filice was instructed to call 911 with any severe reactions post vaccine: Marland Kitchen Difficulty breathing  . Swelling of face and throat  . A fast heartbeat  . A bad rash all over body  . Dizziness and weakness   Immunizations Administered    Name Date Dose VIS Date Route   Pfizer COVID-19 Vaccine 05/08/2019  5:25 PM 0.3 mL 01/23/2019 Intramuscular   Manufacturer: [employer], [employer]   Lot: [id number]   NDC: [address]-4

## 2019-05-29 ENCOUNTER — Ambulatory Visit: Payer: Medicaid Other

## 2019-06-22 ENCOUNTER — Ambulatory Visit: Payer: Medicaid Other | Attending: Internal Medicine

## 2019-06-23 ENCOUNTER — Ambulatory Visit: Payer: Medicaid Other | Attending: Internal Medicine

## 2019-06-23 DIAGNOSIS — Z23 Encounter for immunization: Secondary | ICD-10-CM

## 2019-06-23 NOTE — Progress Notes (Signed)
   Covid-19 Vaccination Clinic  Name:  Jane Brewer    MRN: [id number] DOB: 05-06-1995  06/23/2019  [name] was observed post Covid-19 immunization for 15 minutes without incident. She was provided with Vaccine Information Sheet and instruction to access the V-Safe system.   [name] was instructed to call 911 with any severe reactions post vaccine: Marland Kitchen Difficulty breathing  . Swelling of face and throat  . A fast heartbeat  . A bad rash all over body  . Dizziness and weakness   Immunizations Administered    Name Date Dose VIS Date Route   Pfizer COVID-19 Vaccine 06/23/2019  4:21 PM 0.3 mL 04/08/2018 Intramuscular   Manufacturer: [employer], [employer]   Lot: [id number]   NDC: [address]-1

## 2020-01-11 ENCOUNTER — Other Ambulatory Visit: Payer: Self-pay

## 2020-01-11 ENCOUNTER — Ambulatory Visit (LOCAL_COMMUNITY_HEALTH_CENTER): Payer: Medicaid Other | Admitting: Family Medicine

## 2020-01-11 ENCOUNTER — Encounter: Payer: Self-pay | Admitting: Family Medicine

## 2020-01-11 VITALS — BP 131/79 | Ht 60.0 in | Wt 153.0 lb

## 2020-01-11 DIAGNOSIS — Z3009 Encounter for other general counseling and advice on contraception: Secondary | ICD-10-CM | POA: Diagnosis not present

## 2020-01-11 DIAGNOSIS — Z30431 Encounter for routine checking of intrauterine contraceptive device: Secondary | ICD-10-CM

## 2020-01-11 DIAGNOSIS — Z30432 Encounter for removal of intrauterine contraceptive device: Secondary | ICD-10-CM

## 2020-01-11 DIAGNOSIS — Z01419 Encounter for gynecological examination (general) (routine) without abnormal findings: Secondary | ICD-10-CM

## 2020-01-11 MED ORDER — PRENATAL MULTIVITAMIN CH: 1.0000 | ORAL_TABLET | Freq: Every day | ORAL | Status: DC

## 2020-01-11 MED ORDER — PRENATAL MULTIVITAMIN + DHA 28-0.8 & 200 MG PO MISC: 100.0000 | Freq: Every day | ORAL | 0 refills | Status: AC

## 2020-01-11 NOTE — Progress Notes (Signed)
Family Planning Visit- Repeat Yearly Visit  Subjective:  Jane Brewer is a 24 y.o. [phone]  being seen today for an well woman visit and to discuss family planning options.    She is currently using IUD Mirena for pregnancy prevention. Patient reports she does not if she or her partner wants a pregnancy in the next year. Patient  has Family history of intellectual disabilities; Anemia; and IUD (intrauterine device) in place on their problem list.  Chief Complaint  Patient presents with   Gynecologic Exam    physical and IUD removal    Patient reports desiring a pregnancy in next year and would like to lost weight before then. She plans to use condoms.  Reports migraines < 1x per month. Associated with right eye twitching.   Patient denies --see form   See flowsheet for other program required questions.   Body mass index is 29.88 kg/m. - Patient is eligible for diabetes screening based on BMI and age >19?  no HA1C ordered? no  Patient reports 3 of partners in last year. Desires STI screening?  No - no changes in partner   Has patient been screened once for HCV in the past?  No  No results found for: HCVAB  Does the patient have current of drug use, have a partner with drug use, and/or has been incarcerated since last result? No  If yes-- Screen for HCV through [facility]   Does the patient meet criteria for HBV testing? No  Criteria:  -Household, sexual or needle sharing contact with HBV -History of drug use -HIV positive -Those with known Hep C   Health Maintenance Due  Topic Date Due   Hepatitis C Screening  Never done   INFLUENZA VACCINE  09/13/2019    Review of Systems  Constitutional: Negative for chills and fever.  Eyes: Negative for blurred vision and double vision.  Respiratory: Negative for cough and shortness of breath.   Cardiovascular: Negative for chest pain and orthopnea.  Gastrointestinal: Negative for nausea and vomiting.   Genitourinary: Negative for dysuria, flank pain and frequency.  Musculoskeletal: Negative for myalgias.  Skin: Negative for rash.  Neurological: Negative for dizziness, tingling, weakness and headaches.  Endo/Heme/Allergies: Does not bruise/bleed easily.  Psychiatric/Behavioral: Negative for depression and suicidal ideas. The patient is not nervous/anxious.     The following portions of the patient's history were reviewed and updated as appropriate: allergies, current medications, past family history, past medical history, past social history, past surgical history and problem list. Problem list updated.  Objective:   Vitals:   01/11/20 1550  BP: 131/79  Weight: 153 lb (69.4 kg)  Height: 5' (1.524 m)    Physical Exam Constitutional:      Appearance: Normal appearance.  HENT:     Head: Normocephalic and atraumatic.  Pulmonary:     Effort: Pulmonary effort is normal.  Abdominal:     Palpations: Abdomen is soft.  Musculoskeletal:        General: Normal range of motion.  Skin:    General: Skin is warm and dry.  Neurological:     General: No focal deficit present.     Mental Status: She is alert.  Psychiatric:        Mood and Affect: Mood normal.        Behavior: Behavior normal.     IUD Removal  Patient identified, informed consent performed, consent signed.  Patient was in the dorsal lithotomy position, normal external genitalia was noted.  A speculum was placed in the patient's vagina, normal discharge was noted, no lesions. The cervix was visualized, no lesions, no abnormal discharge.  The strings of the IUD were grasped and pulled using ring forceps. The IUD was removed in its entirety and shown to the patient.  Patient tolerated the procedure well.    Patient will use condoms for contraception and plans for pregnancy soon and she was told to avoid teratogens, take PNV and folic acid.  Routine preventative health maintenance measures emphasized.   Assessment and Plan:   Jane Brewer is a 24 y.o. female [phone] presenting to the [facility] Department for an yearly well woman exam/family planning visit  Contraception counseling: Reviewed all forms of birth control options in the tiered based approach. available including abstinence; over the counter/barrier methods; hormonal contraceptive medication including pill, patch, ring, injection,contraceptive implant, ECP; hormonal and nonhormonal IUDs; permanent sterilization options including vasectomy and the various tubal sterilization modalities. Risks, benefits, and typical effectiveness rates were reviewed.  Questions were answered.  Written information was also given to the patient to review.  Patient desires IUD removal and to use condoms.  She will follow up in  1 yr for surveillance.  She was told to call with any further questions, or with any concerns about this method of contraception.  Emphasized use of condoms 100% of the time for STI prevention.  No need for ECP   1. Well woman exam with routine gynecological exam Pap in March 2020- next in 2023 CBE- not indicated next in March 2023  Declined STI screening, no new partners Reports desire to lose weight, encouraged in this task Desires pregnancy and given information and prenatal vitamins   2. IUD check up Strings visualized- IUD removed today   Return in about 1 year (around 01/10/2021) for Yearly wellness exam.  No future appointments.  Federico Flake, MD

## 2020-01-11 NOTE — Addendum Note (Signed)
Addended by: Harvie Heck on: 01/11/2020 04:46 PM   Modules accepted: Orders

## 2020-01-11 NOTE — Progress Notes (Signed)
Patient here for physical and wants IUD removed. Patient planning to become pregnant next year. No complications with IUD.  Harvie Heck, RN   Post:  RN provided patient with prenatal vitamins per K. Alvester Morin MD orders. Also provided booklet "Are you ready for pregnancy?"  Harvie Heck, RN

## 2020-10-31 ENCOUNTER — Other Ambulatory Visit: Payer: Self-pay

## 2020-10-31 ENCOUNTER — Emergency Department: Payer: Medicaid Other

## 2020-10-31 ENCOUNTER — Emergency Department
Admission: EM | Admit: 2020-10-31 | Discharge: 2020-10-31 | Disposition: A | Discharge status: Home / Self Care | Payer: Medicaid Other | Attending: Emergency Medicine | Admitting: Emergency Medicine

## 2020-10-31 DIAGNOSIS — F449 Dissociative and conversion disorder, unspecified: Secondary | ICD-10-CM

## 2020-10-31 DIAGNOSIS — R110 Nausea: Secondary | ICD-10-CM | POA: Insufficient documentation

## 2020-10-31 DIAGNOSIS — R519 Headache, unspecified: Secondary | ICD-10-CM | POA: Diagnosis present

## 2020-10-31 DIAGNOSIS — I639 Cerebral infarction, unspecified: Secondary | ICD-10-CM

## 2020-10-31 DIAGNOSIS — Z79899 Other long term (current) drug therapy: Secondary | ICD-10-CM | POA: Diagnosis not present

## 2020-10-31 LAB — CBC WITH DIFFERENTIAL/PLATELET
Abs Immature Granulocytes: 0.02 10*3/uL (ref 0.00–0.07)
Basophils Absolute: 0 10*3/uL (ref 0.0–0.1)
Basophils Relative: 0 %
Eosinophils Absolute: 0 10*3/uL (ref 0.0–0.5)
Eosinophils Relative: 0 %
HCT: 42.3 % (ref 36.0–46.0)
Hemoglobin: 14.5 g/dL (ref 12.0–15.0)
Immature Granulocytes: 0 %
Lymphocytes Relative: 38 %
Lymphs Abs: 2.7 10*3/uL (ref 0.7–4.0)
MCH: 32.4 pg (ref 26.0–34.0)
MCHC: 34.3 g/dL (ref 30.0–36.0)
MCV: 94.6 fL (ref 80.0–100.0)
Monocytes Absolute: 0.3 10*3/uL (ref 0.1–1.0)
Monocytes Relative: 4 %
Neutro Abs: 4.1 10*3/uL (ref 1.7–7.7)
Neutrophils Relative %: 58 %
Platelets: 329 10*3/uL (ref 150–400)
RBC: 4.47 MIL/uL (ref 3.87–5.11)
RDW: 12.3 % (ref 11.5–15.5)
WBC: 7.1 10*3/uL (ref 4.0–10.5)
nRBC: 0 % (ref 0.0–0.2)

## 2020-10-31 LAB — URINALYSIS, COMPLETE (UACMP) WITH MICROSCOPIC
Bacteria, UA: NONE SEEN
Bilirubin Urine: NEGATIVE
Glucose, UA: NEGATIVE mg/dL
Ketones, ur: 15 mg/dL — AB
Nitrite: NEGATIVE
Protein, ur: NEGATIVE mg/dL
Specific Gravity, Urine: 1.015 (ref 1.005–1.030)
pH: 8.5 — ABNORMAL HIGH (ref 5.0–8.0)

## 2020-10-31 LAB — COMPREHENSIVE METABOLIC PANEL
ALT: 13 U/L (ref 0–44)
AST: 22 U/L (ref 15–41)
Albumin: 4.7 g/dL (ref 3.5–5.0)
Alkaline Phosphatase: 50 U/L (ref 38–126)
Anion gap: 14 (ref 5–15)
BUN: 5 mg/dL — ABNORMAL LOW (ref 6–20)
CO2: 23 mmol/L (ref 22–32)
Calcium: 8.2 mg/dL — ABNORMAL LOW (ref 8.9–10.3)
Chloride: 103 mmol/L (ref 98–111)
Creatinine, Ser: 0.47 mg/dL (ref 0.44–1.00)
GFR, Estimated: >60 mL/min (ref >60)
Glucose, Bld: 108 mg/dL — ABNORMAL HIGH (ref 70–99)
Potassium: 3 mmol/L — ABNORMAL LOW (ref 3.5–5.1)
Sodium: 140 mmol/L (ref 135–145)
Total Bilirubin: 0.7 mg/dL (ref 0.3–1.2)
Total Protein: 7.7 g/dL (ref 6.5–8.1)

## 2020-10-31 LAB — APTT: aPTT: 28 seconds (ref 24–36)

## 2020-10-31 LAB — CBG MONITORING, ED: Glucose-Capillary: 95 mg/dL (ref 70–99)

## 2020-10-31 LAB — PREGNANCY, URINE: Preg Test, Ur: NEGATIVE

## 2020-10-31 LAB — PROTIME-INR
INR: 1.1 (ref 0.8–1.2)
Prothrombin Time: 13.8 seconds (ref 11.4–15.2)

## 2020-10-31 IMAGING — MR MR HEAD W/O CM
12 series · 48 of 48 positions shown · non-contrast
Comparison: CT head from the same day.

CLINICAL DATA: Neuro deficit, acute, stroke suspected

EXAM:
MRI HEAD WITHOUT CONTRAST
MRA HEAD WITHOUT CONTRAST
TECHNIQUE: Multiplanar, multi-echo pulse sequences of the brain and surrounding
structures were acquired without intravenous contrast. Angiographic
images of the Circle of Willis were acquired using MRA technique
without intravenous contrast.

[Series 5: ax dwi_tracew · axial · 3.0mm · 0.71mm/px · z∈[-163,+2]mm · 3 of 56 slices shown]
[im 1/56]
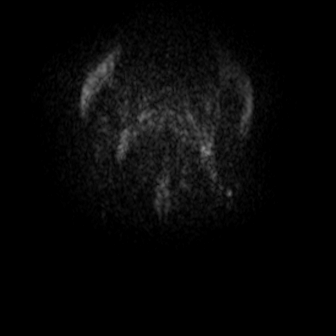
[im 28/56]
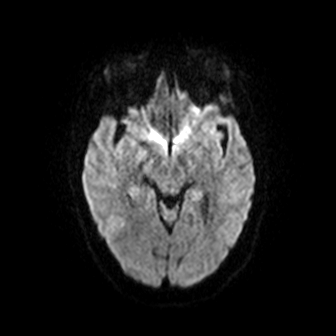
[im 56/56]
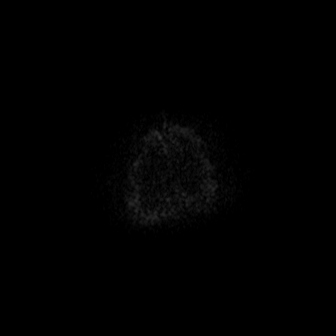

[Series 6: ax dwi_adc · axial · 3.0mm · 0.71mm/px · z∈[-163,+2]mm · 4 of 56 slices shown]
[im 1/56]
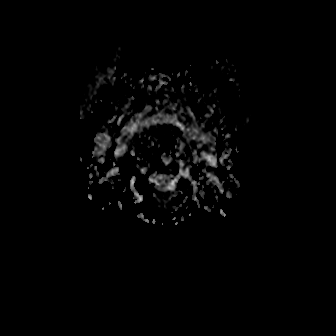
[im 19/56]
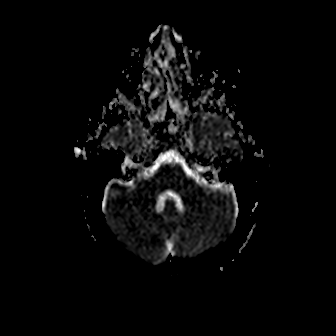
[im 37/56]
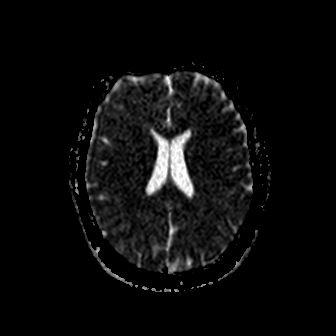
[im 56/56]
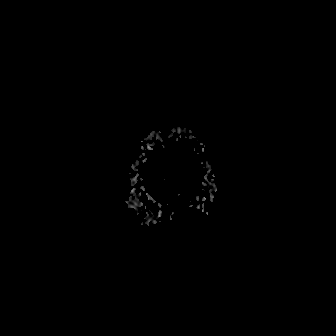

[Series 7: cor dwi_tracew · coronal · 5.0mm · 0.68mm/px · 3 of 40 slices shown]
[im 1/40]
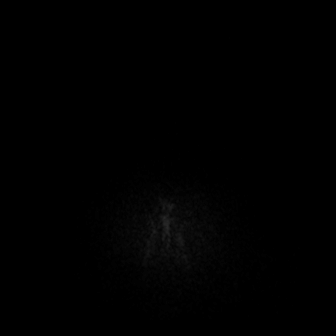
[im 20/40]
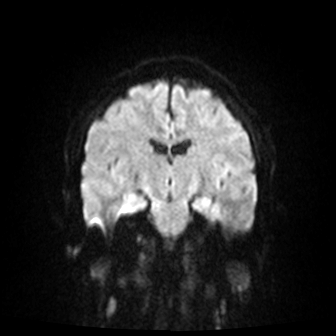
[im 40/40]
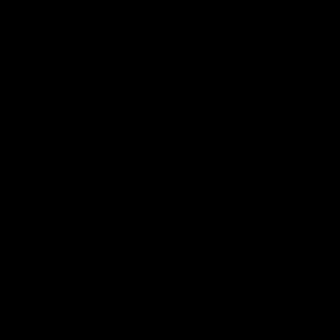

[Series 8: cor dwi_adc · coronal · 5.0mm · 0.68mm/px · 3 of 38 slices shown]
[im 1/38]
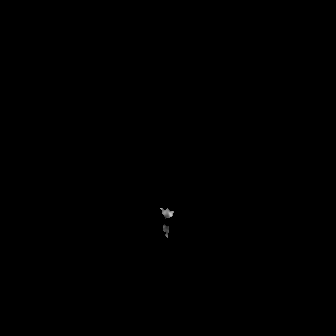
[im 19/38]
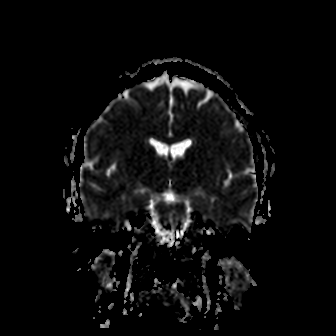
[im 38/38]
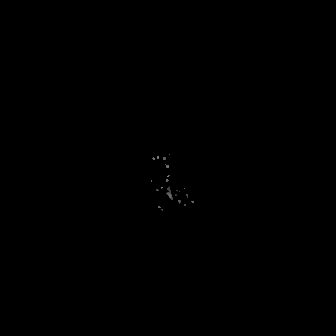

[Series 9: T1 · sagittal · 5.0mm · 0.47mm/px · 2 of 24 slices shown (1 of 2)]
[im 1/24]
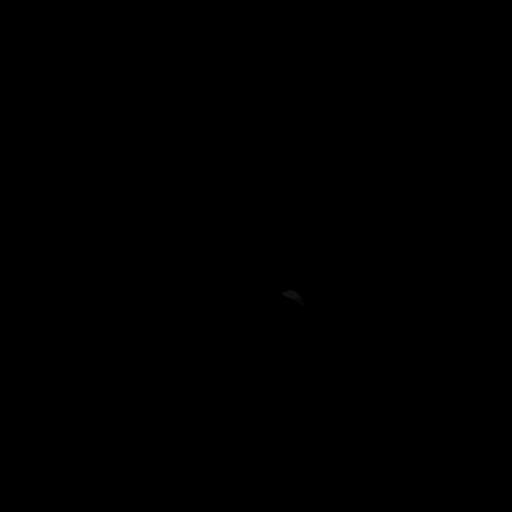
[im 24/24]
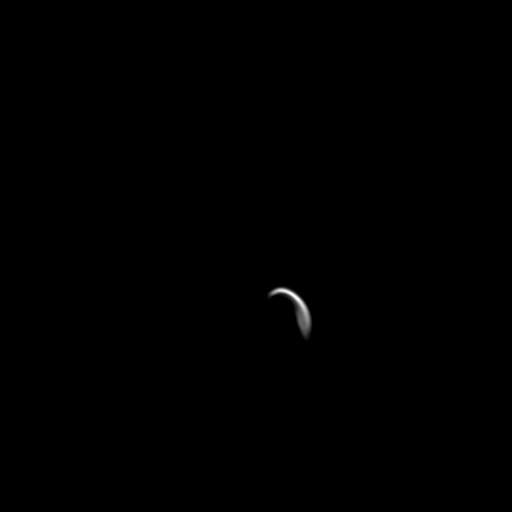

[Series 10: T2 · axial · 5.0mm · 0.86mm/px · z∈[-141,+2]mm · 2 of 25 slices shown (1 of 2)]
[im 1/25]
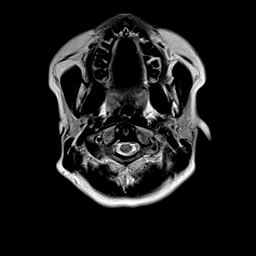
[im 25/25]
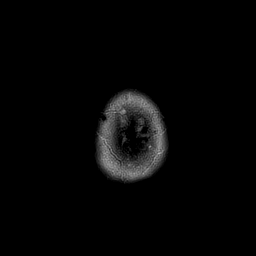

[Series 11: mag_images · axial · 3.0mm · 0.90mm/px · z∈[-145,+7]mm · 4 of 52 slices shown]
[im 1/52]
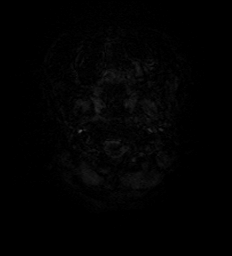
[im 18/52]
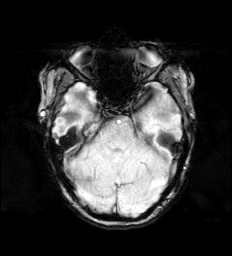
[im 35/52]
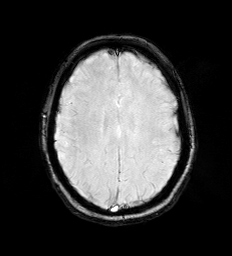
[im 52/52]
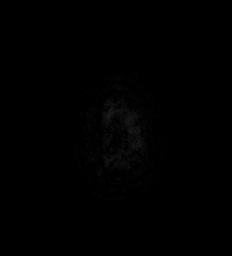

[Series 12: pha_images · axial · 3.0mm · 0.90mm/px · z∈[-145,+7]mm · 4 of 52 slices shown]
[im 1/52]
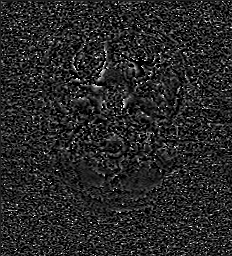
[im 18/52]
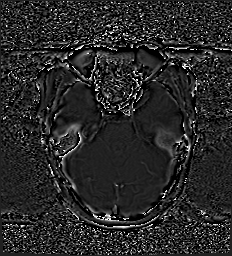
[im 35/52]
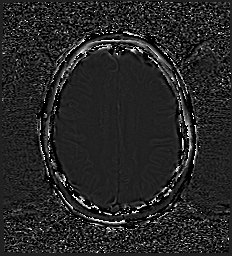
[im 52/52]
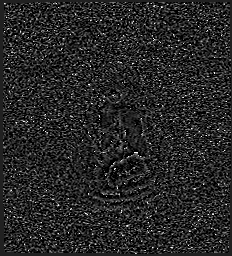

[Series 13: swi_images · axial · 3.0mm · 0.90mm/px · z∈[-145,+7]mm · 4 of 52 slices shown]
[im 1/52]
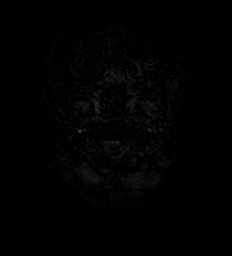
[im 18/52]
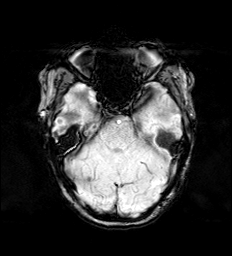
[im 35/52]
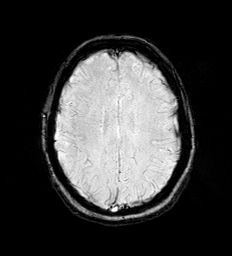
[im 52/52]
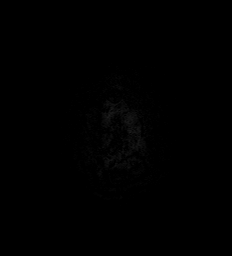

[Series 15: FLAIR · axial · 3.0mm · 0.69mm/px · z∈[-150,+11]mm · 4 of 55 slices shown]
[im 1/55]
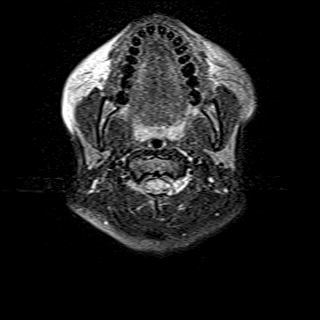
[im 19/55]
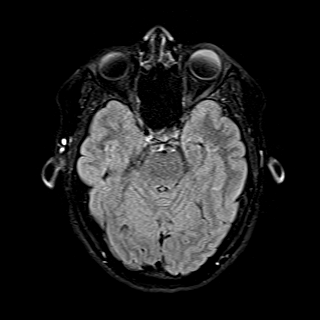
[im 37/55]
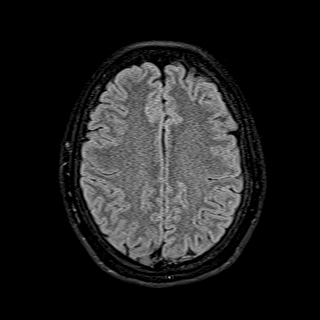
[im 55/55]
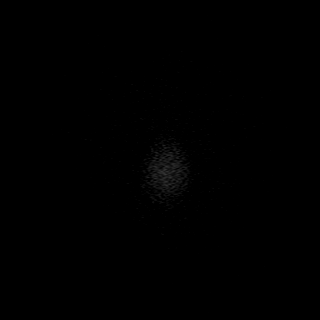

[Series 16: T1 · axial · 1.0mm · 0.98mm/px · z∈[-158,+16]mm · 13 of 176 slices shown (2 of 2)]
[im 1/176]
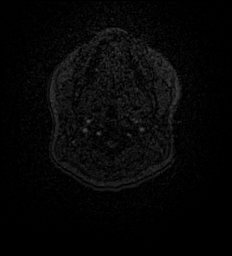
[im 15/176]
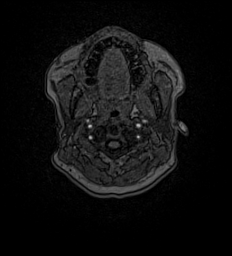
[im 30/176]
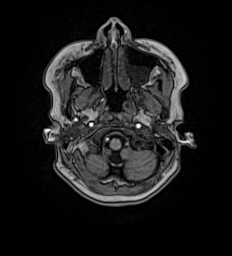
[im 44/176]
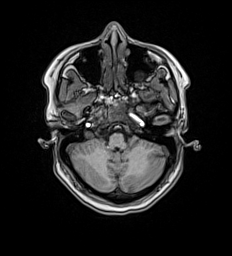
[im 59/176]
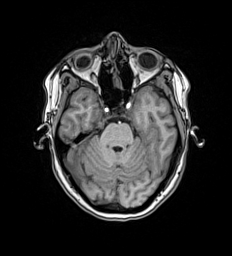
[im 73/176]
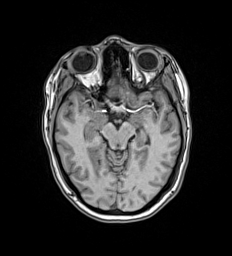
[im 88/176]
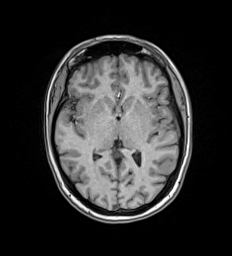
[im 103/176]
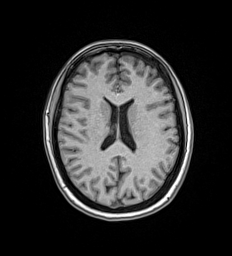
[im 117/176]
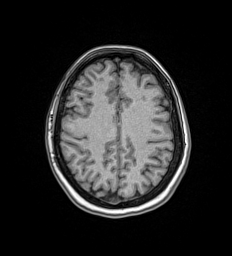
[im 132/176]
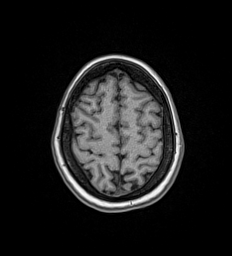
[im 146/176]
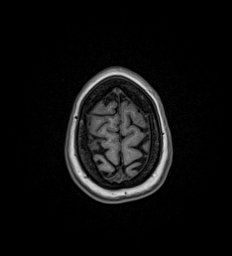
[im 161/176]
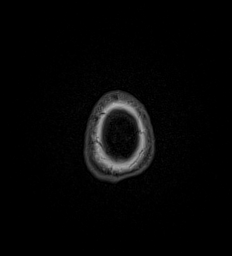
[im 176/176]
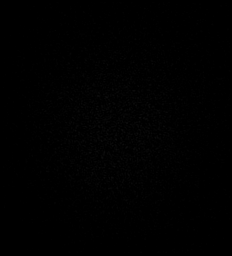

[Series 17: T2 · coronal · 5.0mm · 0.86mm/px · 2 of 30 slices shown (2 of 2)]
[im 1/30]
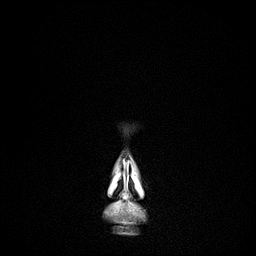
[im 30/30]
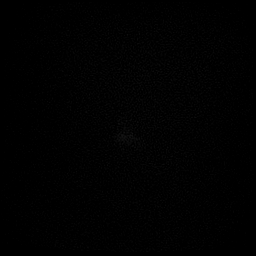

[48 of 48 positions shown; findings below may reference images not displayed]

FINDINGS: MRI HEAD FINDINGS

Brain: No acute infarction, hemorrhage, hydrocephalus, extra-axial
collection or mass lesion. There are a couple scattered punctate T2
hyperintensities within the white matter, nonspecific but within
normal limits for patient age.

Vascular: See below.

Skull and upper cervical spine: Normal marrow signal.

Sinuses/Orbits: Left maxillary sinus retention cyst. No acute
orbital findings.

Other: No mastoid effusions.

MRA HEAD FINDINGS

Anterior circulation: Bilateral intracranial ICAs, MCAs, and ACAs
are patent without proximal hemodynamically significant stenosis. No
aneurysm identified.

Posterior circulation: Bilateral intradural vertebral arteries,
basilar artery, and posterior cerebral arteries are patent without
proximal hemodynamically significant stenosis. No aneurysm
identified.
IMPRESSION: 1. No evidence of acute intracranial abnormality.
2. No large vessel occlusion or proximal hemodynamically significant
stenosis.

## 2020-10-31 IMAGING — MR MR MRA HEAD W/O CM
1 series · 19 of 48 positions shown · non-contrast
Comparison: CT head from the same day.

CLINICAL DATA: Neuro deficit, acute, stroke suspected

EXAM:
MRI HEAD WITHOUT CONTRAST
MRA HEAD WITHOUT CONTRAST
TECHNIQUE: Multiplanar, multi-echo pulse sequences of the brain and surrounding
structures were acquired without intravenous contrast. Angiographic
images of the Circle of Willis were acquired using MRA technique
without intravenous contrast.

[Series 1: TOF · axial · 0.5mm · 0.41mm/px · z∈[-147,-50]mm · 19 of 205 slices shown]
[im 1/205]
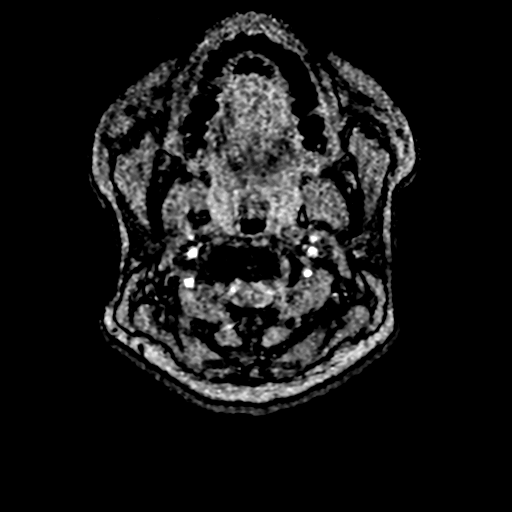
[im 5/205]
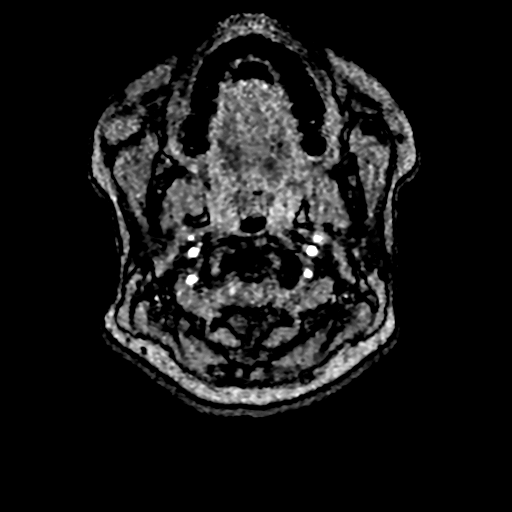
[im 9/205]
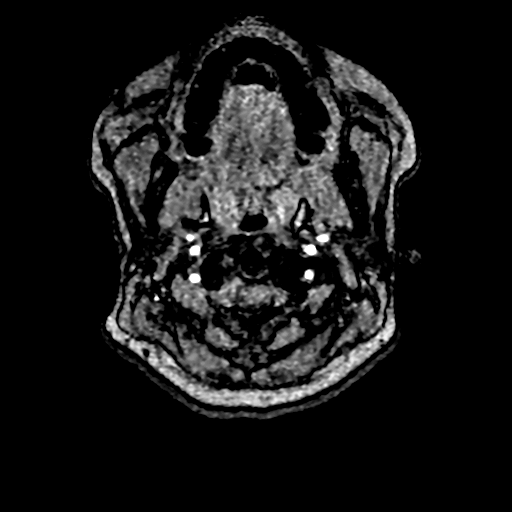
[im 14/205]
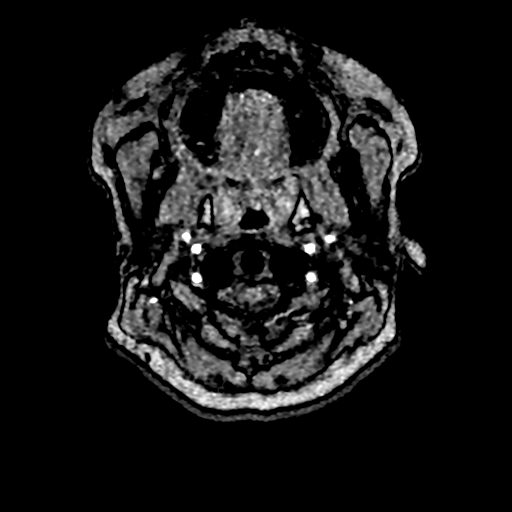
[im 18/205]
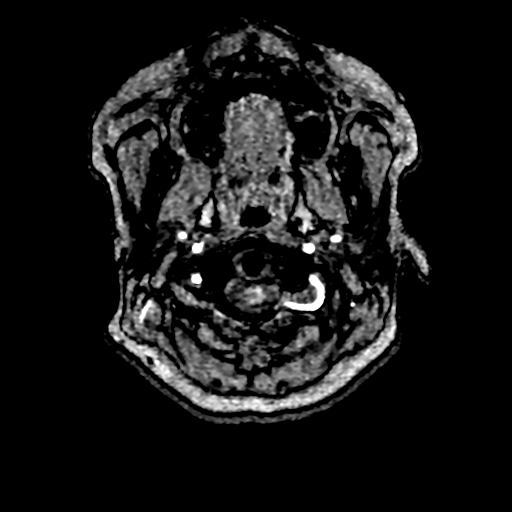
[im 22/205]
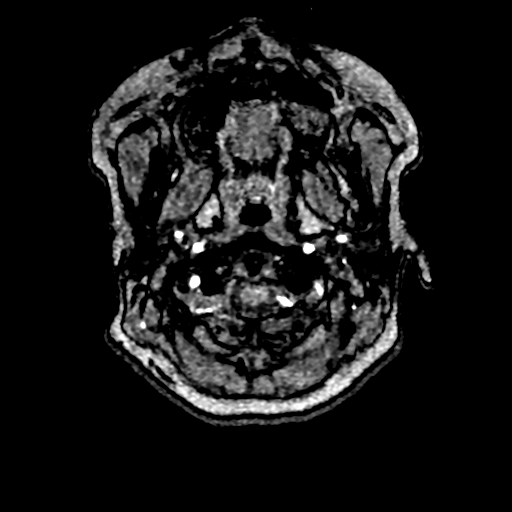
[im 27/205]
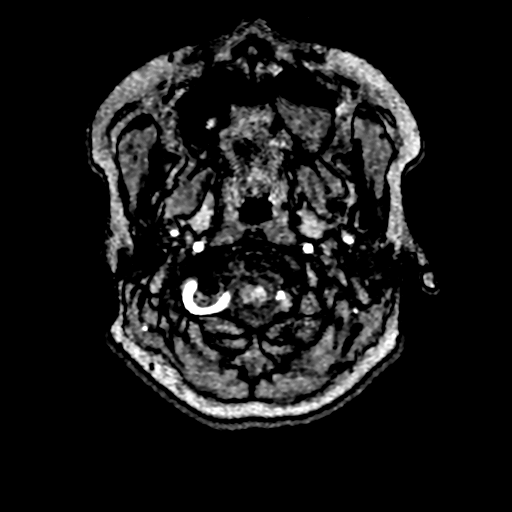
[im 31/205]
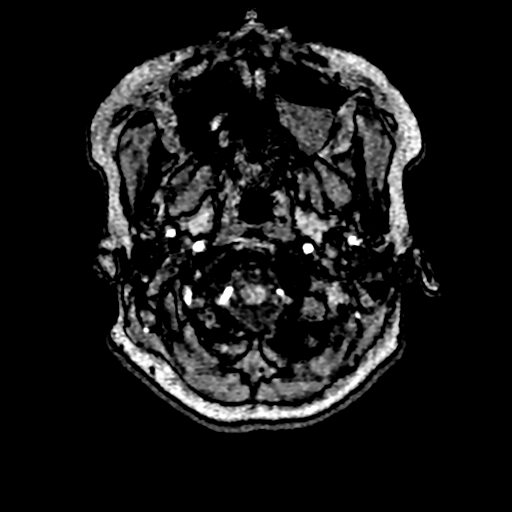
[im 35/205]
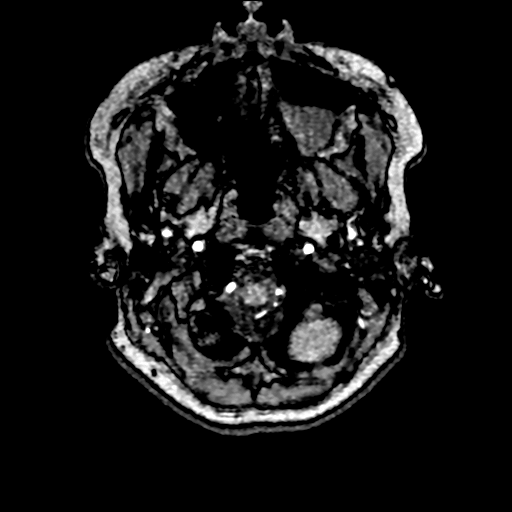
[im 40/205]
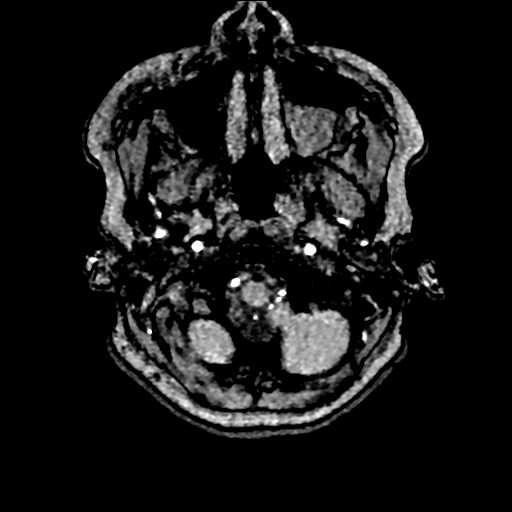
[im 44/205]
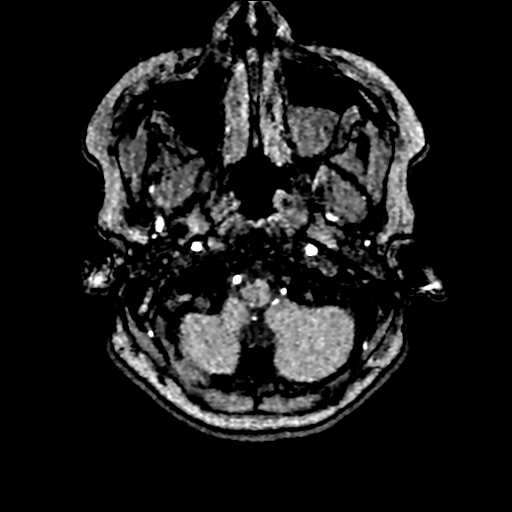
[im 66/205]
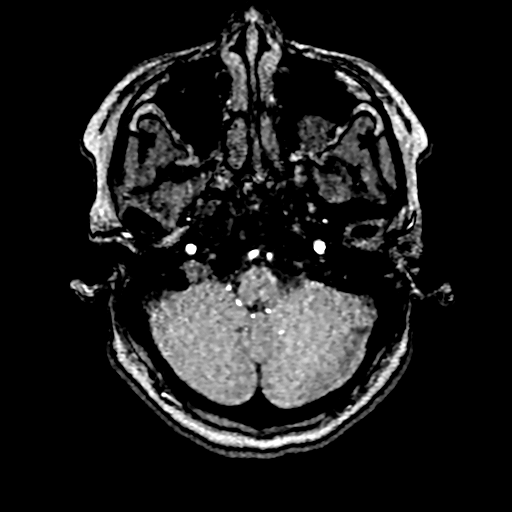
[im 92/205]
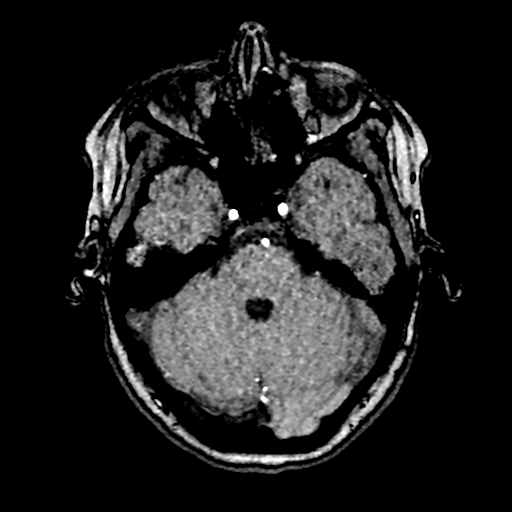
[im 105/205]
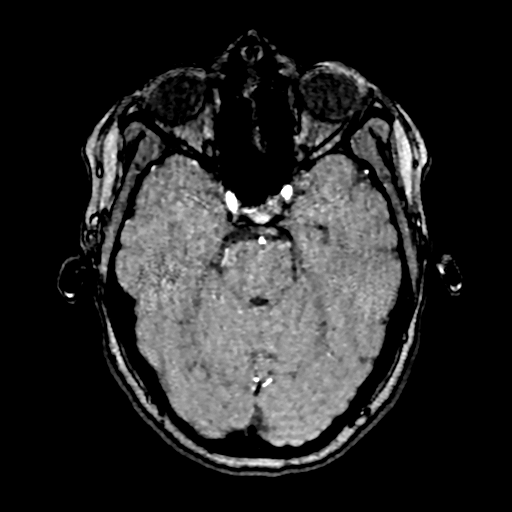
[im 118/205]
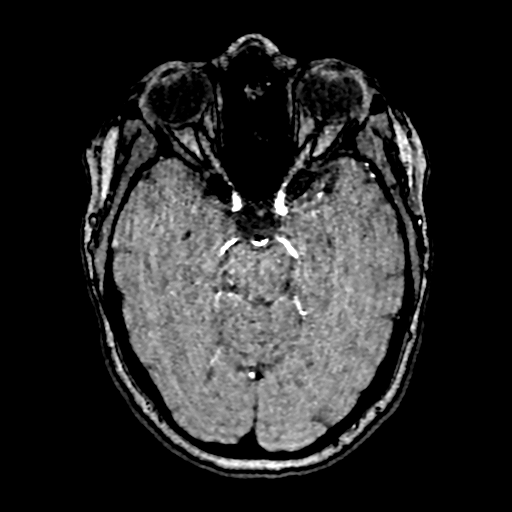
[im 144/205]
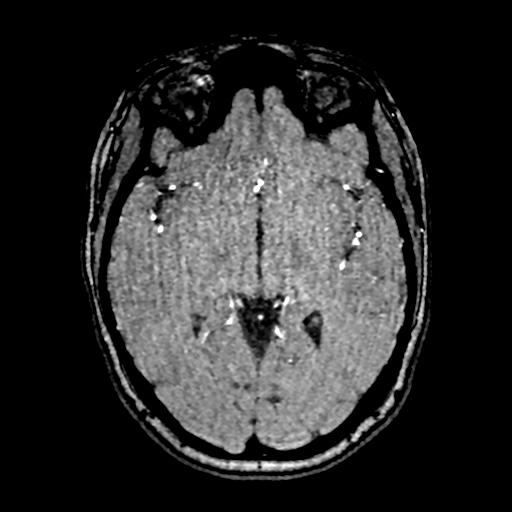
[im 170/205]
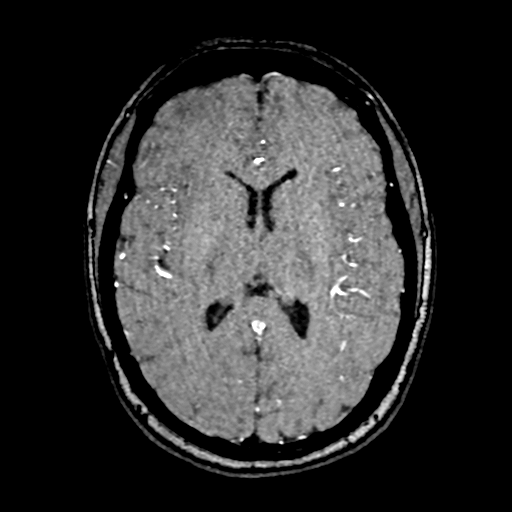
[im 174/205]
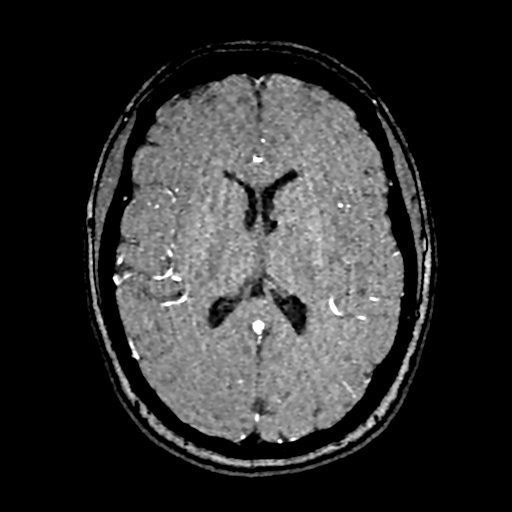
[im 196/205]
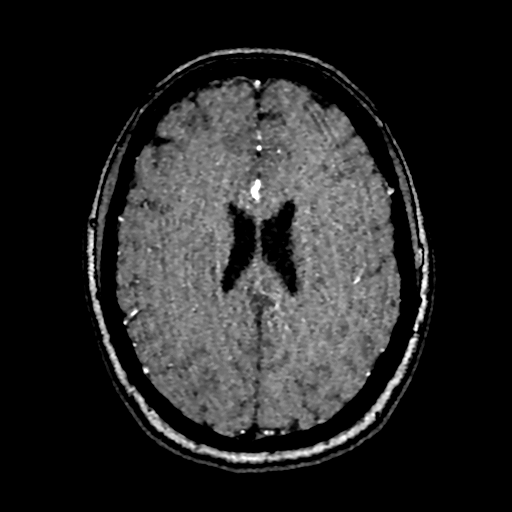

[19 of 48 positions shown; findings below may reference images not displayed]

FINDINGS: MRI HEAD FINDINGS

Brain: No acute infarction, hemorrhage, hydrocephalus, extra-axial
collection or mass lesion. There are a couple scattered punctate T2
hyperintensities within the white matter, nonspecific but within
normal limits for patient age.

Vascular: See below.

Skull and upper cervical spine: Normal marrow signal.

Sinuses/Orbits: Left maxillary sinus retention cyst. No acute
orbital findings.

Other: No mastoid effusions.

MRA HEAD FINDINGS

Anterior circulation: Bilateral intracranial ICAs, MCAs, and ACAs
are patent without proximal hemodynamically significant stenosis. No
aneurysm identified.

Posterior circulation: Bilateral intradural vertebral arteries,
basilar artery, and posterior cerebral arteries are patent without
proximal hemodynamically significant stenosis. No aneurysm
identified.
IMPRESSION: 1. No evidence of acute intracranial abnormality.
2. No large vessel occlusion or proximal hemodynamically significant
stenosis.

## 2020-10-31 IMAGING — CT CT HEAD CODE STROKE
3 series · 16 of 47 positions shown, 19 images · non-contrast
Comparison: None.

CLINICAL DATA: Code stroke.

EXAM:
CT HEAD WITHOUT CONTRAST
TECHNIQUE: Contiguous axial images were obtained from the base of the skull
through the vertex without intravenous contrast.

[Series 4: head wo · axial · 0.42mm/px · z∈[-59,+66]mm · 10 of 31 slices shown, 13 images]
[im 3/31  brain]
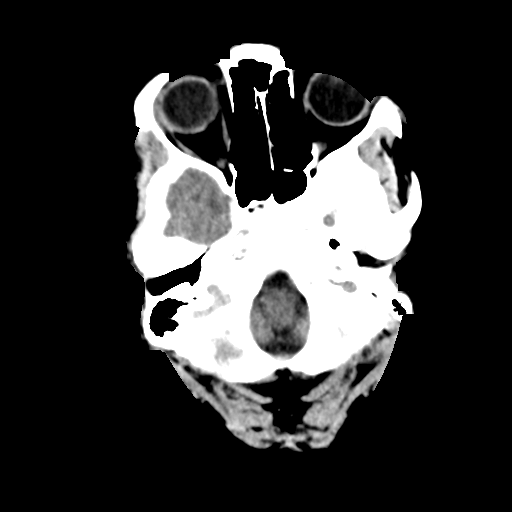
[im 3/31  bone]
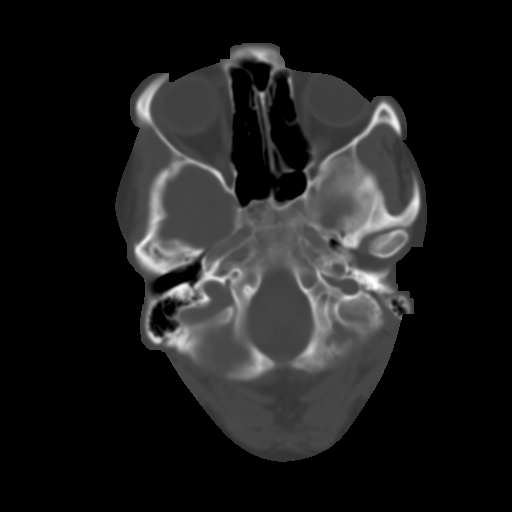
[im 6/31  brain]
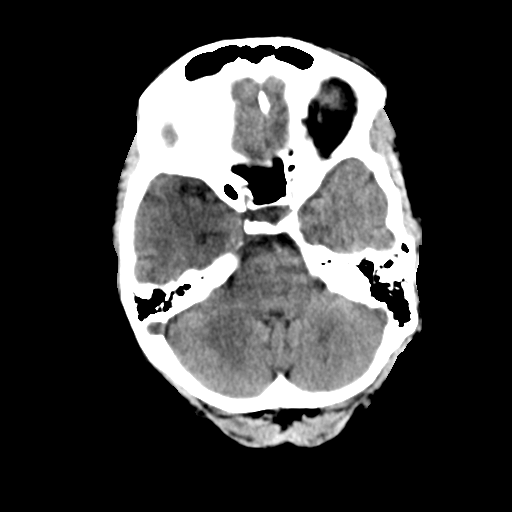
[im 9/31  brain]
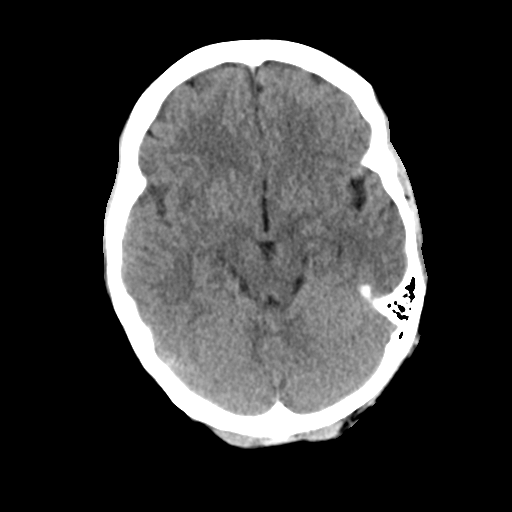
[im 11/31  brain]
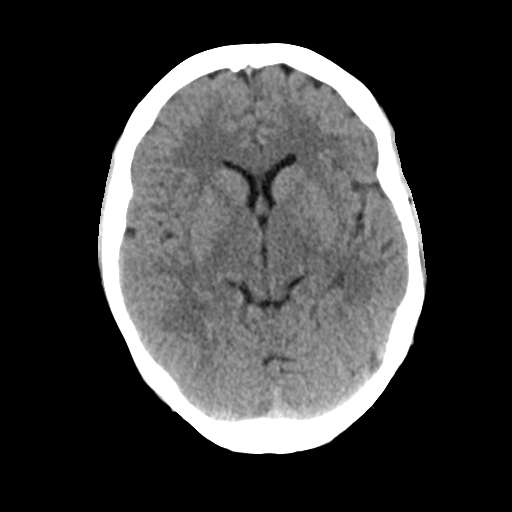
[im 14/31  brain]
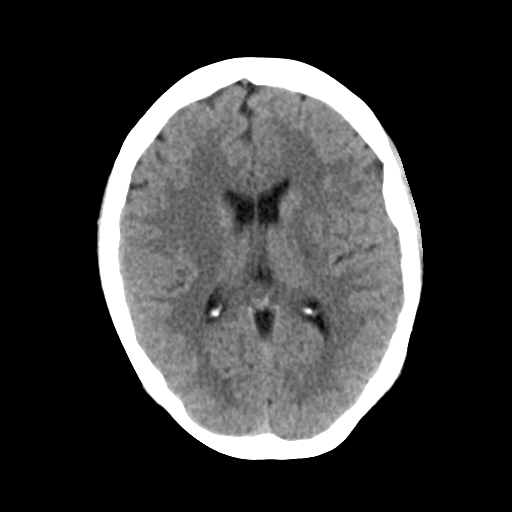
[im 14/31  bone]
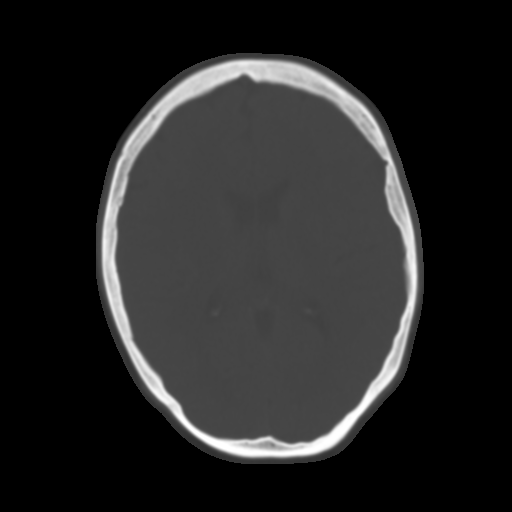
[im 17/31  brain]
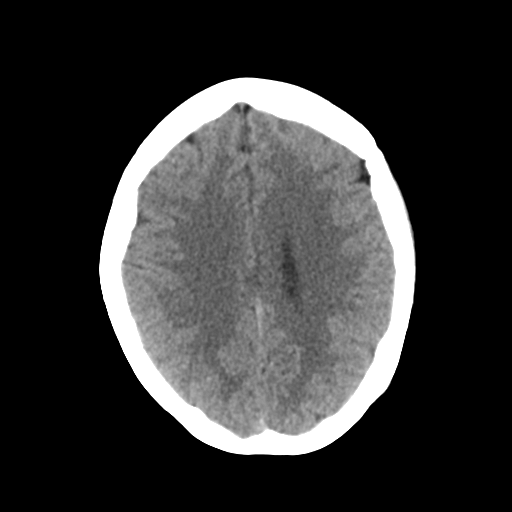
[im 20/31  brain]
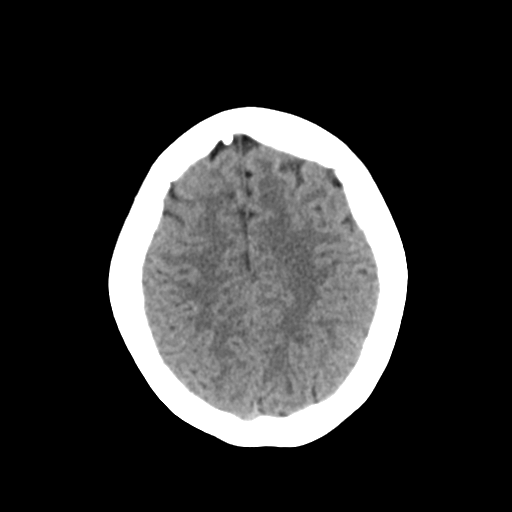
[im 23/31  brain]
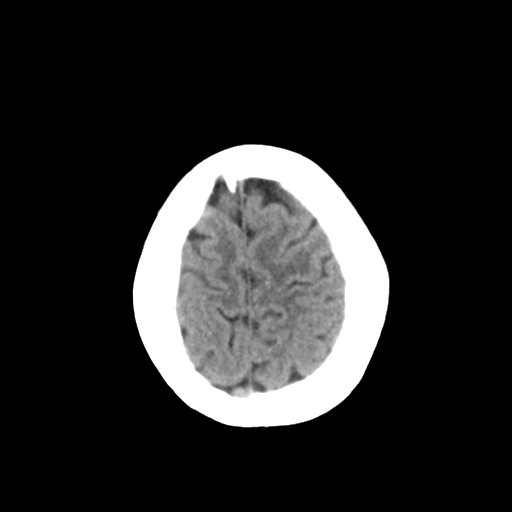
[im 25/31  brain]
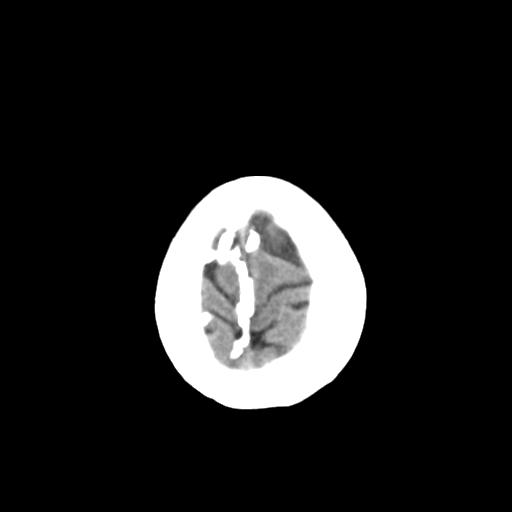
[im 25/31  bone]
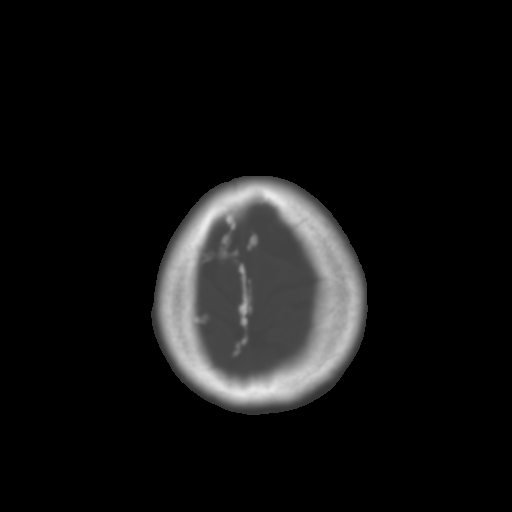
[im 28/31  brain]
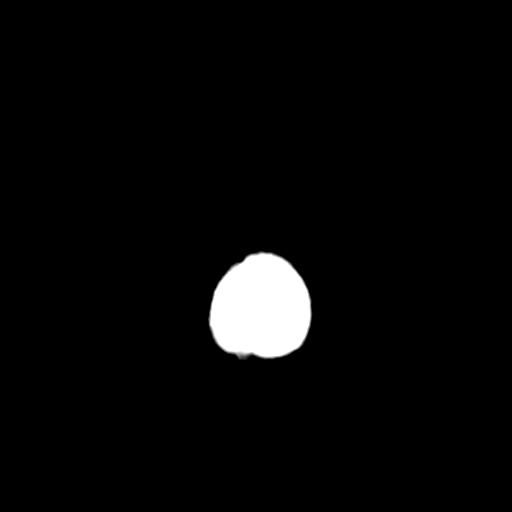

[Series 5: coronal soft tissue · coronal · 0.29mm/px · 3 of 61 slices shown]
[im 21/61  brain]
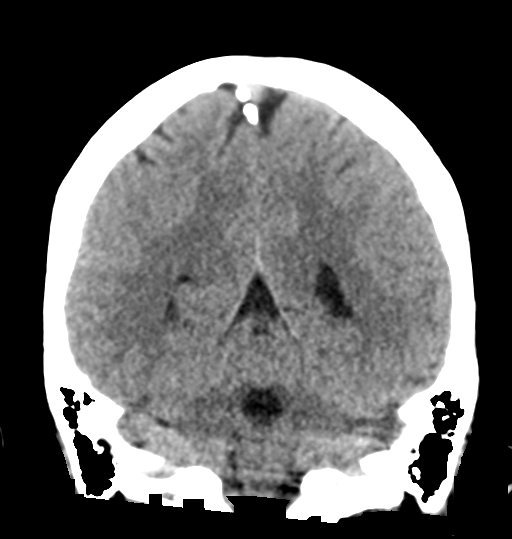
[im 27/61  brain]
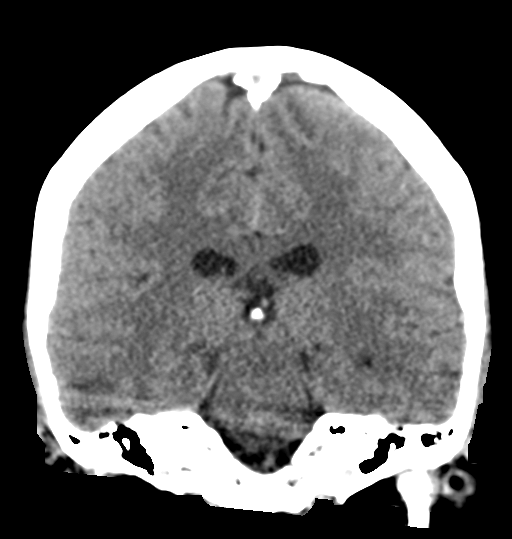
[im 34/61  brain]
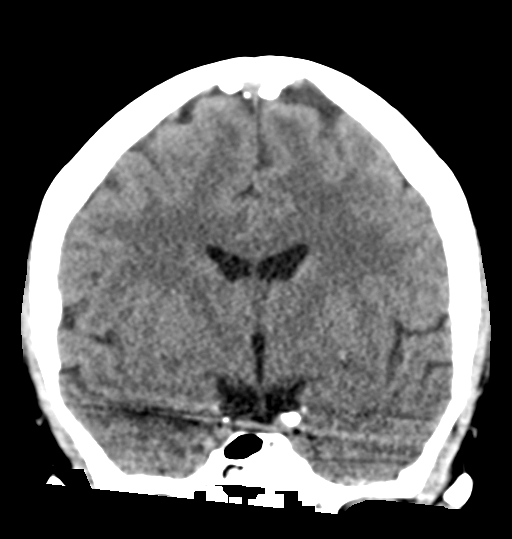

[Series 6: sagittal soft tissue · sagittal · 0.31mm/px · 3 of 50 slices shown]
[im 17/50  brain]
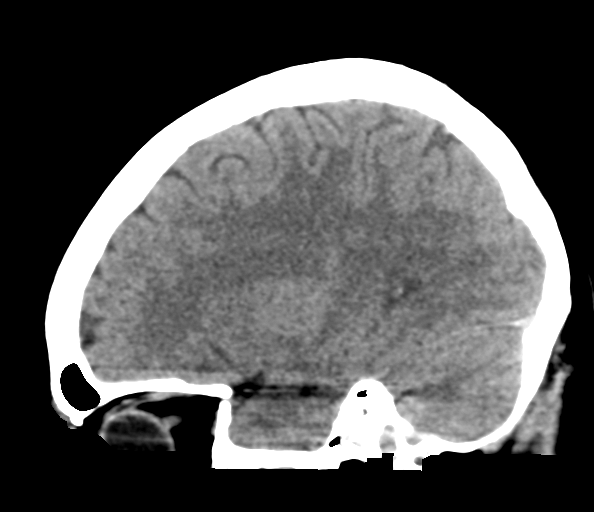
[im 25/50  brain]
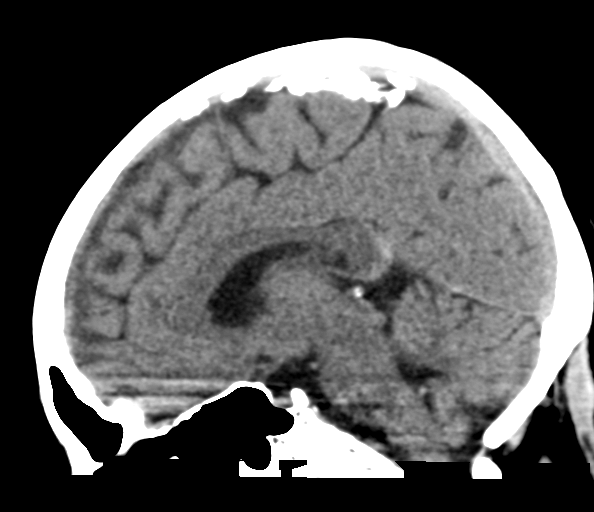
[im 33/50  brain]
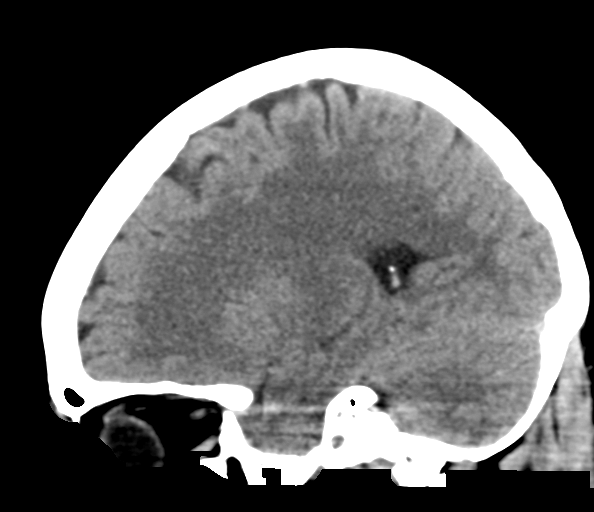

[16 of 47 positions shown; findings below may reference images not displayed]

FINDINGS: Brain: There is no acute intracranial hemorrhage, mass effect, or
edema. Gray-white differentiation is preserved. Ventricles and sulci
are normal in size and configuration. No extra-axial collection.

Vascular: No hyperdense vessel.

Skull: Unremarkable.

Sinuses/Orbits: No significant opacification. Orbits are
unremarkable.

Other: Mastoid air cells are clear.

ASPECTS (Alberta Stroke Program Early CT Score)

- Ganglionic level infarction (caudate, lentiform nuclei, internal
capsule, insula, M1-M3 cortex): 7

- Supraganglionic infarction (M4-M6 cortex): 3

Total score (0-10 with 10 being normal): 10
IMPRESSION: There is no acute intracranial hemorrhage or evidence of acute
infarction. ASPECT score is 10.

## 2020-10-31 MED ORDER — SODIUM CHLORIDE 0.9 % IV BOLUS
1000.0000 mL | Freq: Once | INTRAVENOUS | Status: AC
  Administered 2020-10-31: 1000 mL via INTRAVENOUS

## 2020-10-31 MED ORDER — SODIUM CHLORIDE 0.9% FLUSH
3.0000 mL | Freq: Once | INTRAVENOUS | Status: DC
  Administered 2020-10-31: 3 mL via INTRAVENOUS

## 2020-10-31 MED ORDER — PROCHLORPERAZINE EDISYLATE 10 MG/2ML IJ SOLN
10.0000 mg | Freq: Once | INTRAMUSCULAR | Status: AC
  Administered 2020-10-31: 10 mg via INTRAVENOUS
  Filled 2020-10-31: qty 2

## 2020-10-31 NOTE — ED Provider Notes (Signed)
[facility] Emergency Department Provider Note   ____________________________________________   Event Date/Time   First MD Initiated Contact with Patient 10/31/20 1018     (approximate)  I have reviewed the triage vital signs and the nursing notes.   HISTORY  Chief Complaint Nausea and Headache   HPI Jane Brewer is a 25 y.o. female who reports nausea and headache for 1 week.  Patient had no prior history of headaches.  The headache is worse on the right side.  She has no history of headaches.  The headache did not seem to come on rapidly.  She began to get some dizziness last night and then this morning left arm started to tingle at about 7:00.  Patient came in and was noticed to have worsening and obvious weakness in the left hand.  She then developed some weakness and tingling in the left leg as well.  We code called a code stroke and neurology came to see the patient.  CT was negative.  He ordered an MRI.  He discussed with me the fact that he thought that this could be a complex or complicated migraine.  I therefore ordered some Compazine and Zofran and patient's headache and arm and leg numbness is gone.  We will still get the MRI.       Past Medical History:  Diagnosis Date   Acid reflux    with pregnancy   Anemia    age 80 years and with first pregnancy   Nausea    with pregnancy    Patient Active Problem List   Diagnosis Date Noted   IUD (intrauterine device) in place 01/06/2019   Anemia    Family history of intellectual disabilities     Past Surgical History:  Procedure Laterality Date   no surgical history      Prior to Admission medications   Medication Sig Start Date End Date Taking? Authorizing Provider  ferrous sulfate 325 (65 FE) MG tablet Take 1 tablet (325 mg total) by mouth daily with breakfast. Patient not taking: Reported on 12/03/2018 09/12/18   Federico Flake, MD  ibuprofen (ADVIL) 800 MG tablet  Take 1 tablet (800 mg total) by mouth every 8 (eight) hours as needed for moderate pain or cramping. Patient not taking: Reported on 01/06/2019 11/28/18   Christeen Douglas, MD  Multiple Vitamins-Minerals (MULTIVITAMIN) tablet Take 1 tablet by mouth daily. Patient not taking: Reported on 01/11/2020 01/06/19   Federico Flake, MD  Prenatal MV-Min-Fe Fum-FA-DHA (PRENATAL MULTIVITAMIN + DHA) 28-0.8 & 200 MG MISC Take 100 tablets by mouth daily. 01/11/20   Federico Flake, MD  Prenatal Vit-Fe Fumarate-FA (PRENATAL MULTIVITAMIN) TABS tablet Take 1 tablet by mouth daily at 12 noon. Patient not taking: Reported on 01/11/2020    [provider]    Allergies Patient has no known allergies.  Family History  Problem Relation Age of Onset   Hypertension Mother    Multiple births Mother    Multiple births Maternal Aunt    Miscarriages / Stillbirths Maternal Aunt    Epilepsy Maternal Uncle    Cirrhosis Maternal Grandmother    Diabetes Maternal Grandfather     Social History Social History   Tobacco Use   Smoking status: Never   Smokeless tobacco: Never  Vaping Use   Vaping Use: Never used  Substance Use Topics   Alcohol use: Yes    Comment: occasionally   Drug use: No    Review of Systems  Constitutional: No fever/chills Eyes: No visual changes. ENT: No sore throat. Cardiovascular: Denies chest pain. Respiratory: Denies shortness of breath. Gastrointestinal: No abdominal pain.  No nausea, no vomiting.  No diarrhea.  No constipation. Genitourinary: Negative for dysuria. Musculoskeletal: Negative for back pain. Skin: Negative for rash. Neurological: See HPI  ____________________________________________   PHYSICAL EXAM:  VITAL SIGNS: ED Triage Vitals  Enc Vitals Group     BP 10/31/20 0943 138/88     Pulse Rate 10/31/20 0943 79     Resp 10/31/20 0943 16     Temp 10/31/20 0943 99.3 F (37.4 C)     Temp Source 10/31/20 0943 Oral     SpO2 10/31/20 0943  99 %     Weight 10/31/20 0950 145 lb (65.8 kg)     Height 10/31/20 0950 5\' 3"  (1.6 m)     Head Circumference --      Peak Flow --      Pain Score 10/31/20 0950 6     Pain Loc --      Pain Edu? --      Excl. in GC? --     Constitutional: Alert and oriented.  Appears anxious but in no acute distress Eyes: Conjunctivae are normal. PERRL. EOMI. funduscopic exam was normal bilaterally Head: Atraumatic. Nose: No congestion/rhinnorhea. Mouth/Throat: Mucous membranes are moist.  Oropharynx non-erythematous. Neck: No stridor.  Cardiovascular: Normal rate, regular rhythm. Grossly normal heart sounds.  Good peripheral circulation. Respiratory: Normal respiratory effort.  No retractions. Lungs CTAB. Gastrointestinal: Soft and nontender. No distention. No abdominal bruits. No CVA tenderness. Musculoskeletal: No lower extremity tenderness nor edema.   Neurologic:  Normal speech and language.  Initially patient had some Skin:  Skin is warm, dry and intact. No rash noted. Psychiatric: Mood and affect are normal. Speech and behavior are normal.  ____________________________________________   LABS (all labs ordered are listed, but only abnormal results are displayed)  Labs Reviewed  URINALYSIS, COMPLETE (UACMP) WITH MICROSCOPIC - Abnormal; Notable for the following components:      Result Value   Color, Urine STRAW (*)    pH 8.5 (*)    Hgb urine dipstick MODERATE (*)    Ketones, ur 15 (*)    Leukocytes,Ua TRACE (*)    All other components within normal limits  COMPREHENSIVE METABOLIC PANEL - Abnormal; Notable for the following components:   Potassium 3.0 (*)    Glucose, Bld 108 (*)    BUN 5 (*)    Calcium 8.2 (*)    All other components within normal limits  PROTIME-INR  APTT  CBC WITH DIFFERENTIAL/PLATELET  DIFFERENTIAL  PREGNANCY, URINE  CBG MONITORING, ED   ____________________________________________  EKG   ____________________________________________  RADIOLOGY 11/02/20, personally viewed and evaluated these images (plain radiographs) as part of my medical decision making, as well as reviewing the written report by the radiologist.  ED MD interpretation: CT read by radiology reviewed by me shows no acute disease.  Official radiology report(s): CT HEAD CODE STROKE WO CONTRAST  Result Date: 10/31/2020 CLINICAL DATA:  Code stroke. EXAM: CT HEAD WITHOUT CONTRAST TECHNIQUE: Contiguous axial images were obtained from the base of the skull through the vertex without intravenous contrast. COMPARISON:  None. FINDINGS: Brain: There is no acute intracranial hemorrhage, mass effect, or edema. Gray-white differentiation is preserved. Ventricles and sulci are normal in size and configuration. No extra-axial collection. Vascular: No hyperdense vessel. Skull: Unremarkable. Sinuses/Orbits: No significant opacification. Orbits are unremarkable. Other: Mastoid air  cells are clear. ASPECTS (Alberta Stroke Program Early CT Score) - Ganglionic level infarction (caudate, lentiform nuclei, internal capsule, insula, M1-M3 cortex): 7 - Supraganglionic infarction (M4-M6 cortex): 3 Total score (0-10 with 10 being normal): 10 IMPRESSION: There is no acute intracranial hemorrhage or evidence of acute infarction. ASPECT score is 10. Electronically Signed   By: Guadlupe Spanish M.D.   On: 10/31/2020 10:16    ____________________________________________   PROCEDURES  Procedure(s) performed (including Critical Care):  Procedures   ____________________________________________   INITIAL IMPRESSION / ASSESSMENT AND PLAN / ED COURSE ----------------------------------------- 4:56 PM on 10/31/2020 -----------------------------------------  I am going to sign out the patient to the oncoming physician. MRI has still not started at this point.  Patient's symptoms have gone.  Anticipate the MRI will be negative.  I am presuming the diagnosis will be complicated migraine.               ____________________________________________   FINAL CLINICAL IMPRESSION(S) / ED DIAGNOSES  Final diagnoses:  Bad headache     ED Discharge Orders     None        Note:  This document was prepared using Dragon voice recognition software and may include unintentional dictation errors.    Arnaldo Natal, MD 10/31/20 [phone]

## 2020-10-31 NOTE — ED Provider Notes (Signed)
MRI negative  Reevaluated patient and she is well-appearing.  They declined an interpreter the husband is there who interprets for her.  Patient states that she is feeling much better at this time no headache.  Only reports feeling some hunger.  At this time patient and husband feel comfortable with discharge home and will take Tylenol ibuprofen to help with any headaches and will return to the ER if she develops worsening symptoms or any other concerns  I discussed the provisional nature of ED diagnosis, the treatment so far, the ongoing plan of care, follow up appointments and return precautions with the patient and any family or support people present. They expressed understanding and agreed with the plan, discharged home.      Concha Se, MD 10/31/20 [phone]

## 2020-10-31 NOTE — Progress Notes (Signed)
   10/31/20 1000  Clinical Encounter Type  Visited With Patient  Visit Type Initial  Referral From Nurse  Consult/Referral To Chaplain   Chaplain responded to code stroke page. Chaplain attempted to established initial spiritual care, but medical staff were actively assessing PT. Chaplain ministered with a peaceful presence, and silent prayers at the door of the PT's room. Chaplain told Diplomatic Services operational officer to contact chaplain if further needed.  Posey Boyer, M.Div

## 2020-10-31 NOTE — ED Notes (Signed)
Code  stroke  called  to  [facility] 

## 2020-10-31 NOTE — Discharge Instructions (Addendum)
It appears that she had a migraine headache.  Your other studies were all normal.  Please follow-up with Dr. Malvin Johns or Dr. Sherryll Burger the neurologist in town.  They can make sure that any further migraines are treated appropriately.  Please return here for any further problems.

## 2020-10-31 NOTE — ED Notes (Signed)
Warm blanket given

## 2020-10-31 NOTE — Consult Note (Signed)
NEURO HOSPITALIST CONSULT NOTE   Requestig physician: Dr. Darnelle Catalan  Reason for Consult: Acute left sided weakness and numbness  History obtained from:  Patient     HPI:                                                                                                                                          Jane Brewer is an 25 y.o. female presenting with a 7 day history of severe migrainous headache, followed by onset of vertigo and gait unsteadiness on awakening yesterday morning, progressing to left sided weakness on awakening today. LKN was 9 PM on Saturday night before going to bed. On awakening Sunday, she had new symptoms of vertigo and gait unsteadiness in conjunction with her headache. She tried to rest yesterday and took some ibuprofen, then went to bed with the above symptoms still present on Sunday evening. This morning, she awoke with new onset of left arm and leg weakness with sensory numbness, as well as right arm tremor.   Her headache has waxed and waned, involves the right forehead, temple, parietal region and posterolateral right neck. It is throbbing and was 10/10 on Saturday, then down to 5/10 yesterday, but now is almost resolved with residual right neck pain per patient. She has no visual symptoms other than right eyelid fluttering. She does endorse photophobia. She has a prior history of migraines during pregnancy, but has had none since giving birth.   She states that she has been under significant stress recently.   Past Medical History:  Diagnosis Date   Acid reflux    with pregnancy   Anemia    age 23 years and with first pregnancy   Nausea    with pregnancy    Past Surgical History:  Procedure Laterality Date   no surgical history      Family History  Problem Relation Age of Onset   Hypertension Mother    Multiple births Mother    Multiple births Maternal Aunt    Miscarriages / Stillbirths Maternal Aunt    Epilepsy  Maternal Uncle    Cirrhosis Maternal Grandmother    Diabetes Maternal Grandfather              Social History:  reports that she has never smoked. She has never used smokeless tobacco. She reports current alcohol use. She reports that she does not use drugs.  No Known Allergies  MEDICATIONS:  No current facility-administered medications on file prior to encounter.   Current Outpatient Medications on File Prior to Encounter  Medication Sig Dispense Refill   ferrous sulfate 325 (65 FE) MG tablet Take 1 tablet (325 mg total) by mouth daily with breakfast. (Patient not taking: Reported on 12/03/2018) 100 tablet 3   ibuprofen (ADVIL) 800 MG tablet Take 1 tablet (800 mg total) by mouth every 8 (eight) hours as needed for moderate pain or cramping. (Patient not taking: Reported on 01/06/2019) 30 tablet 1   Multiple Vitamins-Minerals (MULTIVITAMIN) tablet Take 1 tablet by mouth daily. (Patient not taking: Reported on 01/11/2020) 100 tablet 0   Prenatal MV-Min-Fe Fum-FA-DHA (PRENATAL MULTIVITAMIN + DHA) 28-0.8 & 200 MG MISC Take 100 tablets by mouth daily. 1 each 0   Prenatal Vit-Fe Fumarate-FA (PRENATAL MULTIVITAMIN) TABS tablet Take 1 tablet by mouth daily at 12 noon. (Patient not taking: Reported on 01/11/2020)      ROS:                                                                                                                                       Denies SOB but is hyperventilating intermittently. No speech deficit. No right sided weakness. Has vomited. Other ROS as per HPI, with comprehensive ROS otherwise negative.    Blood pressure 138/88, pulse 79, temperature 99.3 F (37.4 C), temperature source Oral, resp. rate 12, height 5\' 3"  (1.6 m), weight 65.8 kg, SpO2 100 %, currently breastfeeding.   General Examination:                                                                                                        Physical Exam  HEENT-  [place]/AT    Lungs- Respirations unlabored Extremities- No edema  Neurological Examination Mental Status: Alert, fully oriented, anxious affect. Speech fluent with intact comprehension. Mostly speaks Spanish with some English also used.  . Cranial Nerves: II: PERRL.Temporal visual fields intact with no extinction to DSS.   III,IV, VI: No ptosis, but holds right eyelid slightly closed at times, which resolves with distraction. EOMI. No nystagmus.  V,VII: Face symmetric, facial temp sensation subjectively decreased on the left.  VIII: Hearing intact to voice IX,X: No hypophonia XI: Symmetric shoulder shrug in the context of giveway LUE weakness XII: Midline tongue extension Motor: LUE with giveway weakness and strength that improves with coaching proximally and distally; max strength elicitable is 4/5. Initially unable to elevate LUE antigravity, she does so with continuous coaching  and is also able to slowly perform FNF with LUE in the context of an irregular tremor that is provoked by this maneuver.  RUE with intermittent tremors at rest that are irregular in frequency, vary in amplitude and resolve with distraction, but increase during spontaneous hyperventilation in conjunction with marked increase in anxious affect. FNF without ataxia.  RLE 5/5 proximally and distally. H-S normal.  LLE 3-4/5 with giveway weakness and strength that improves with coaching. Performs H-S with tremulous movements.  Sensory: Temp sensation equal to BUE, decreased to LLE Deep Tendon Reflexes: 2+ and symmetric throughout Plantars: Right: downgoing   Left: downgoing Cerebellar: As above Gait: Deferred   Lab Results: Basic Metabolic Panel: No results for input(s): NA, K, CL, CO2, GLUCOSE, BUN, CREATININE, CALCIUM, MG, PHOS in the last 168 hours.  CBC: Recent Labs  Lab 10/31/20 0954  WBC 7.1  NEUTROABS 4.1  HGB 14.5  HCT 42.3  MCV 94.6  PLT  329    Cardiac Enzymes: No results for input(s): CKTOTAL, CKMB, CKMBINDEX, TROPONINI in the last 168 hours.  Lipid Panel: No results for input(s): CHOL, TRIG, HDL, CHOLHDL, VLDL, LDLCALC in the last 168 hours.  Imaging: CT HEAD CODE STROKE WO CONTRAST  Result Date: 10/31/2020 CLINICAL DATA:  Code stroke. EXAM: CT HEAD WITHOUT CONTRAST TECHNIQUE: Contiguous axial images were obtained from the base of the skull through the vertex without intravenous contrast. COMPARISON:  None. FINDINGS: Brain: There is no acute intracranial hemorrhage, mass effect, or edema. Gray-white differentiation is preserved. Ventricles and sulci are normal in size and configuration. No extra-axial collection. Vascular: No hyperdense vessel. Skull: Unremarkable. Sinuses/Orbits: No significant opacification. Orbits are unremarkable. Other: Mastoid air cells are clear. ASPECTS ([place] Stroke Program Early CT Score) - Ganglionic level infarction (caudate, lentiform nuclei, internal capsule, insula, M1-M3 cortex): 7 - Supraganglionic infarction (M4-M6 cortex): 3 Total score (0-10 with 10 being normal): 10 IMPRESSION: There is no acute intracranial hemorrhage or evidence of acute infarction. ASPECT score is 10. Electronically Signed   By: Guadlupe Spanish M.D.   On: 10/31/2020 10:16     Assessment: 25 year old female presenting with a 7 day history of severe migrainous headache, followed by onset of vertigo and gait unsteadiness on awakening yesterday morning, progressing to left sided weakness on awakening today.  1. Exam findings are with several functional attributes and do not localize to a single location along the neuraxis.  2. Stroke felt to be less likely than conversion disorder, but will need MRI to rule out.   Recommendations: 1. MRI brain 2. MRA neck 3. PRN migraine cocktail.  4. Frequent neuro checks 5. Further recommendations pending MRI results.    Electronically signed: Dr. Caryl Pina 10/31/2020, 11:00  AM

## 2020-10-31 NOTE — Consult Note (Signed)
CODE STROKE- PHARMACY COMMUNICATION   Time CODE STROKE called/page received:[id number]  Time response to CODE STROKE was made (in person or via phone): 1015  No TNK given. Symptoms stated >24 hrs ago. Possible complex migraine.  Name of Provider/Nurse contacted:Amber Page   Past Medical History:  Diagnosis Date   Acid reflux    with pregnancy   Anemia    age 25 years and with first pregnancy   Nausea    with pregnancy   Prior to Admission medications   Medication Sig Start Date End Date Taking? Authorizing Provider  ferrous sulfate 325 (65 FE) MG tablet Take 1 tablet (325 mg total) by mouth daily with breakfast. Patient not taking: Reported on 12/03/2018 09/12/18   Federico Flake, MD  ibuprofen (ADVIL) 800 MG tablet Take 1 tablet (800 mg total) by mouth every 8 (eight) hours as needed for moderate pain or cramping. Patient not taking: Reported on 01/06/2019 11/28/18   Christeen Douglas, MD  Multiple Vitamins-Minerals (MULTIVITAMIN) tablet Take 1 tablet by mouth daily. Patient not taking: Reported on 01/11/2020 01/06/19   Federico Flake, MD  Prenatal MV-Min-Fe Fum-FA-DHA (PRENATAL MULTIVITAMIN + DHA) 28-0.8 & 200 MG MISC Take 100 tablets by mouth daily. 01/11/20   Federico Flake, MD  Prenatal Vit-Fe Fumarate-FA (PRENATAL MULTIVITAMIN) TABS tablet Take 1 tablet by mouth daily at 12 noon. Patient not taking: Reported on 01/11/2020    [provider]   Aziel Morgan Rodriguez-Guzman PharmD, BCPS 10/31/2020 11:12 AM

## 2020-10-31 NOTE — ED Notes (Signed)
Patient up to restroom with no issues

## 2020-10-31 NOTE — ED Notes (Addendum)
Neurologist lindzen arrived at bedside

## 2020-10-31 NOTE — ED Notes (Signed)
Patient transported to MRI 

## 2020-10-31 NOTE — ED Notes (Signed)
MD Lindzen explained to patient he will be placing orders for MRI tests. Also leaning more towards complicated migraine then stroke. Will know more after scans

## 2020-10-31 NOTE — ED Triage Notes (Addendum)
Pt to ED for nausea and headache x 1 week. No hx headaches. +dizziness that started last night. Reports tingling in left arm that started at 0700. Weak grip in left hand. Reports emesis after eating  Verbal order code stroke Dr Darnelle Catalan.  Pt to ct at 1002 NAD noted
